# Patient Record
Sex: Female | Born: 1955 | Race: White | Hispanic: No | Marital: Married | Smoking: Never smoker
Health system: Southern US, Community
[De-identification: ages and names within clinical notes are randomized; demographics above are authoritative.]

## PROBLEM LIST (undated history)

## (undated) DIAGNOSIS — Z803 Family history of malignant neoplasm of breast: Secondary | ICD-10-CM

## (undated) DIAGNOSIS — M7700 Medial epicondylitis, unspecified elbow: Secondary | ICD-10-CM

## (undated) DIAGNOSIS — L719 Rosacea, unspecified: Secondary | ICD-10-CM

## (undated) DIAGNOSIS — U071 COVID-19: Secondary | ICD-10-CM

## (undated) DIAGNOSIS — J329 Chronic sinusitis, unspecified: Secondary | ICD-10-CM

## (undated) DIAGNOSIS — M7710 Lateral epicondylitis, unspecified elbow: Secondary | ICD-10-CM

## (undated) HISTORY — DX: Medial epicondylitis, unspecified elbow: M77.00

## (undated) HISTORY — DX: Rosacea, unspecified: L71.9

## (undated) HISTORY — DX: Chronic sinusitis, unspecified: J32.9

## (undated) HISTORY — DX: Family history of malignant neoplasm of breast: Z80.3

## (undated) HISTORY — PX: WISDOM TOOTH EXTRACTION: SHX21

## (undated) HISTORY — DX: COVID-19: U07.1

## (undated) HISTORY — DX: Lateral epicondylitis, unspecified elbow: M77.10

---

## 2017-01-10 ENCOUNTER — Telehealth: Payer: Self-pay | Admitting: Family

## 2017-01-10 NOTE — Telephone Encounter (Signed)
Pt is a new patient and will see Dr. Shirlee LatchMcLean on 01/28/2017. She is requesting a refill on 60 Doxycycline Hyclate 50mg  cap. She is only requesting enough until her appt with Dr. Shirlee LatchMcLean. Please call her at [phone].

## 2017-01-10 NOTE — Telephone Encounter (Signed)
See below message I advised her to contact PCP , due to we didn't have we didn't know dosage of medication or her allergies. Patient verbalized understanding.

## 2017-01-28 ENCOUNTER — Other Ambulatory Visit (HOSPITAL_COMMUNITY)
Admission: RE | Admit: 2017-01-28 | Discharge: 2017-01-28 | Disposition: A | Discharge status: Home / Self Care | Payer: BC Managed Care – PPO | Source: Ambulatory Visit | Attending: Internal Medicine | Admitting: Internal Medicine

## 2017-01-28 ENCOUNTER — Ambulatory Visit: Payer: BC Managed Care – PPO | Admitting: Internal Medicine

## 2017-01-28 ENCOUNTER — Encounter: Payer: Self-pay | Admitting: Internal Medicine

## 2017-01-28 VITALS — BP 110/72 | HR 95 | Temp 98.5°F | Ht 66.75 in | Wt 159.4 lb

## 2017-01-28 DIAGNOSIS — Z1329 Encounter for screening for other suspected endocrine disorder: Secondary | ICD-10-CM

## 2017-01-28 DIAGNOSIS — N841 Polyp of cervix uteri: Secondary | ICD-10-CM

## 2017-01-28 DIAGNOSIS — Z1159 Encounter for screening for other viral diseases: Secondary | ICD-10-CM | POA: Diagnosis not present

## 2017-01-28 DIAGNOSIS — E559 Vitamin D deficiency, unspecified: Secondary | ICD-10-CM

## 2017-01-28 DIAGNOSIS — Z124 Encounter for screening for malignant neoplasm of cervix: Secondary | ICD-10-CM | POA: Insufficient documentation

## 2017-01-28 DIAGNOSIS — M7701 Medial epicondylitis, right elbow: Secondary | ICD-10-CM | POA: Diagnosis not present

## 2017-01-28 DIAGNOSIS — Z0000 Encounter for general adult medical examination without abnormal findings: Secondary | ICD-10-CM | POA: Diagnosis not present

## 2017-01-28 DIAGNOSIS — M7711 Lateral epicondylitis, right elbow: Secondary | ICD-10-CM

## 2017-01-28 DIAGNOSIS — M7710 Lateral epicondylitis, unspecified elbow: Secondary | ICD-10-CM | POA: Insufficient documentation

## 2017-01-28 DIAGNOSIS — L719 Rosacea, unspecified: Secondary | ICD-10-CM | POA: Insufficient documentation

## 2017-01-28 DIAGNOSIS — Z803 Family history of malignant neoplasm of breast: Secondary | ICD-10-CM | POA: Insufficient documentation

## 2017-01-28 DIAGNOSIS — N889 Noninflammatory disorder of cervix uteri, unspecified: Secondary | ICD-10-CM | POA: Insufficient documentation

## 2017-01-28 DIAGNOSIS — M7700 Medial epicondylitis, unspecified elbow: Secondary | ICD-10-CM | POA: Insufficient documentation

## 2017-01-28 LAB — CBC WITH DIFFERENTIAL/PLATELET
BASOS PCT: 0.8 % (ref 0.0–3.0)
Basophils Absolute: 0 10*3/uL (ref 0.0–0.1)
EOS PCT: 9.2 % — AB (ref 0.0–5.0)
Eosinophils Absolute: 0.5 10*3/uL (ref 0.0–0.7)
HCT: 42.8 % (ref 36.0–46.0)
HEMOGLOBIN: 14.3 g/dL (ref 12.0–15.0)
Lymphocytes Relative: 43 % (ref 12.0–46.0)
Lymphs Abs: 2.2 10*3/uL (ref 0.7–4.0)
MCHC: 33.5 g/dL (ref 30.0–36.0)
MCV: 96.3 fl (ref 78.0–100.0)
MONO ABS: 0.4 10*3/uL (ref 0.1–1.0)
Monocytes Relative: 8.3 % (ref 3.0–12.0)
NEUTROS ABS: 2 10*3/uL (ref 1.4–7.7)
NEUTROS PCT: 38.7 % — AB (ref 43.0–77.0)
PLATELETS: 279 10*3/uL (ref 150.0–400.0)
RBC: 4.45 Mil/uL (ref 3.87–5.11)
RDW: 13 % (ref 11.5–15.5)
WBC: 5 10*3/uL (ref 4.0–10.5)

## 2017-01-28 LAB — URINALYSIS, ROUTINE W REFLEX MICROSCOPIC
Bilirubin Urine: NEGATIVE
Hgb urine dipstick: NEGATIVE
Ketones, ur: NEGATIVE
Nitrite: NEGATIVE
RBC / HPF: NONE SEEN (ref 0–?)
Specific Gravity, Urine: 1.01 (ref 1.000–1.030)
Total Protein, Urine: NEGATIVE
URINE GLUCOSE: NEGATIVE
Urobilinogen, UA: 0.2 (ref 0.0–1.0)
pH: 6 (ref 5.0–8.0)

## 2017-01-28 LAB — COMPREHENSIVE METABOLIC PANEL
ALT: 19 U/L (ref 0–35)
AST: 26 U/L (ref 0–37)
Albumin: 4.2 g/dL (ref 3.5–5.2)
Alkaline Phosphatase: 102 U/L (ref 39–117)
BUN: 19 mg/dL (ref 6–23)
CO2: 31 meq/L (ref 19–32)
Calcium: 9.5 mg/dL (ref 8.4–10.5)
Chloride: 102 mEq/L (ref 96–112)
Creatinine, Ser: 1.02 mg/dL (ref 0.40–1.20)
GFR: 58.27 mL/min — ABNORMAL LOW (ref >60.00)
GLUCOSE: 84 mg/dL (ref 70–99)
POTASSIUM: 4.3 meq/L (ref 3.5–5.1)
SODIUM: 139 meq/L (ref 135–145)
Total Bilirubin: 0.8 mg/dL (ref 0.2–1.2)
Total Protein: 7.6 g/dL (ref 6.0–8.3)

## 2017-01-28 LAB — LIPID PANEL
CHOLESTEROL: 176 mg/dL (ref 0–200)
HDL: 68.3 mg/dL (ref >39.00)
LDL CALC: 95 mg/dL (ref 0–99)
NonHDL: 107.34
Total CHOL/HDL Ratio: 3
Triglycerides: 64 mg/dL (ref 0.0–149.0)
VLDL: 12.8 mg/dL (ref 0.0–40.0)

## 2017-01-28 LAB — T4, FREE: FREE T4: 0.73 ng/dL (ref 0.60–1.60)

## 2017-01-28 LAB — TSH: TSH: 2.3 u[IU]/mL (ref 0.35–4.50)

## 2017-01-28 LAB — VITAMIN D 25 HYDROXY (VIT D DEFICIENCY, FRACTURES): VITD: 49.11 ng/mL (ref 30.00–100.00)

## 2017-01-28 MED ORDER — METRONIDAZOLE 1 % EX GEL: Freq: Every day | CUTANEOUS | 1 refills | Status: DC

## 2017-01-28 MED ORDER — RALOXIFENE HCL 60 MG PO TABS: 60.0000 mg | ORAL_TABLET | Freq: Every day | ORAL | 1 refills | Status: DC

## 2017-01-28 NOTE — Progress Notes (Signed)
Chief Complaint  Patient presents with  . Establish Care   New patient to establish care previous at [facility] physical  1. FH breast cancer in mom and m aunt has never had genetic testing was on Tamoxifen x 5 years and then switched to Raloxifene and prior PCP was managing and she has been on this medication x 2 years w/o generic counseling. Advised pt I am unsure if we should continue this medication would like for OB/GYN to weigh in and also will refer [facility] for genetic testing BRCA and my myriad but unsure if OB/GYN does this at their clinic.  2. Rosacea-She has been on Doxycycline x years and reports tried Metrogel in the past so long cant remember if helped of not  3. Medial and lateral epicondylitis she had steroid inj at [facility] 06/2016 and has tried bracing   Review of Systems  Constitutional: Negative for weight loss.  HENT: Positive for hearing loss.        +wears hearing aids   Respiratory: Negative for shortness of breath.   Cardiovascular: Negative for chest pain.  Gastrointestinal: Negative for abdominal pain.  Genitourinary:       Denies GU bleeding   Musculoskeletal: Positive for joint pain.  Skin:       +rosacea   Neurological: Negative for headaches.  Psychiatric/Behavioral: Negative for memory loss.   Past Medical History:  Diagnosis Date  . Family history of breast cancer    mother and m. aunt   . Lateral epicondylitis   . Medial epicondylitis    Past Surgical History:  Procedure Laterality Date  . WISDOM TOOTH EXTRACTION     Family History  Problem Relation Age of Onset  . Arthritis Mother   . Cancer Mother        breast   . Cancer Father        prostate, kidney  . Alcohol abuse Sister   . Cancer Sister   . Early death Sister   . Cancer Maternal Aunt        breast   . Arthritis Sister    Social History   Socioeconomic History  . Marital status: Married    Spouse name: Not on file  . Number of children: Not on file   . Years of education: Not on file  . Highest education level: Not on file  Social Needs  . Financial resource strain: Not on file  . Food insecurity - worry: Not on file  . Food insecurity - inability: Not on file  . Transportation needs - medical: Not on file  . Transportation needs - non-medical: Not on file  Occupational History  . Not on file  Tobacco Use  . Smoking status: Never Smoker  . Smokeless tobacco: Never Used  Substance and Sexual Activity  . Alcohol use: No    Frequency: Never  . Drug use: No  . Sexual activity: Not on file  Other Topics Concern  . 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 buPROPion (WELLBUTRIN XL) 150 MG 24 hr tablet Take 150 mg by mouth daily.  . Calcium Carb-Cholecalciferol (CALCIUM 600-D PO) Take 1 tablet by mouth daily.  . Cholecalciferol (VITAMIN D-3) 1000 units CAPS Take 1 capsule by mouth daily.  Marland Kitchen doxycycline (ADOXA) 50 MG tablet Take 50 mg by mouth daily.   Marland Kitchen  metroNIDAZOLE (METROGEL) 1 % gel Apply topically daily. Qd to face for rosacea  . Omega-3 Fatty Acids (FISH OIL) 1200 MG CAPS Take 1 capsule by mouth daily.  . raloxifene (EVISTA) 60 MG tablet Take 1 tablet (60 mg total) by mouth daily.  . [DISCONTINUED] raloxifene (EVISTA) 60 MG tablet Take 60 mg by mouth daily.   No Known Allergies Recent Results (from the past 2160 hour(s))  Comprehensive metabolic panel     Status: Abnormal   Collection Time: 01/28/17  9:54 AM  Result Value Ref Range   Sodium 139 135 - 145 mEq/L   Potassium 4.3 3.5 - 5.1 mEq/L   Chloride 102 96 - 112 mEq/L   CO2 31 19 - 32 mEq/L   Glucose, Bld 84 70 - 99 mg/dL   BUN 19 6 - 23 mg/dL   Creatinine, Ser 1.02 0.40 - 1.20 mg/dL   Total Bilirubin 0.8 0.2 - 1.2 mg/dL   Alkaline Phosphatase 102 39 - 117 U/L   AST 26 0 - 37 U/L   ALT 19 0 - 35 U/L   Total Protein 7.6 6.0 - 8.3 g/dL   Albumin 4.2 3.5 - 5.2 g/dL   Calcium  9.5 8.4 - 10.5 mg/dL   GFR 58.27 (L) >60.00 mL/min  CBC with Differential/Platelet     Status: Abnormal   Collection Time: 01/28/17  9:54 AM  Result Value Ref Range   WBC 5.0 4.0 - 10.5 K/uL   RBC 4.45 3.87 - 5.11 Mil/uL   Hemoglobin 14.3 12.0 - 15.0 g/dL   HCT 42.8 36.0 - 46.0 %   MCV 96.3 78.0 - 100.0 fl   MCHC 33.5 30.0 - 36.0 g/dL   RDW 13.0 11.5 - 15.5 %   Platelets 279.0 150.0 - 400.0 K/uL   Neutrophils Relative % 38.7 (L) 43.0 - 77.0 %   Lymphocytes Relative 43.0 12.0 - 46.0 %   Monocytes Relative 8.3 3.0 - 12.0 %   Eosinophils Relative 9.2 (H) 0.0 - 5.0 %   Basophils Relative 0.8 0.0 - 3.0 %   Neutro Abs 2.0 1.4 - 7.7 K/uL   Lymphs Abs 2.2 0.7 - 4.0 K/uL   Monocytes Absolute 0.4 0.1 - 1.0 K/uL   Eosinophils Absolute 0.5 0.0 - 0.7 K/uL   Basophils Absolute 0.0 0.0 - 0.1 K/uL  Lipid panel     Status: None   Collection Time: 01/28/17  9:54 AM  Result Value Ref Range   Cholesterol 176 0 - 200 mg/dL    Comment: ATP III Classification       Desirable:  < 200 mg/dL               Borderline High:  200 - 239 mg/dL          High:  > = 240 mg/dL   Triglycerides 64.0 0.0 - 149.0 mg/dL    Comment: Normal:  <150 mg/dLBorderline High:  150 - 199 mg/dL   HDL 68.30 >39.00 mg/dL   VLDL 12.8 0.0 - 40.0 mg/dL   LDL Cholesterol 95 0 - 99 mg/dL   Total CHOL/HDL Ratio 3     Comment:                Men          Women1/2 Average Risk     3.4          3.3Average Risk          5.0  4.42X Average Risk          9.6          7.13X Average Risk          15.0          11.0                       NonHDL 107.34     Comment: NOTE:  Non-HDL goal should be 30 mg/dL higher than patient's LDL goal (i.e. LDL goal of < 70 mg/dL, would have non-HDL goal of < 100 mg/dL)  T4, free     Status: None   Collection Time: 01/28/17  9:54 AM  Result Value Ref Range   Free T4 0.73 0.60 - 1.60 ng/dL    Comment: Specimens from patients who are undergoing biotin therapy and /or ingesting biotin supplements may  contain high levels of biotin.  The higher biotin concentration in these specimens interferes with this Free T4 assay.  Specimens that contain high levels  of biotin may cause false high results for this Free T4 assay.  Please interpret results in light of the total clinical presentation of the patient.    TSH     Status: None   Collection Time: 01/28/17  9:54 AM  Result Value Ref Range   TSH 2.30 0.35 - 4.50 uIU/mL  Urinalysis, Routine w reflex microscopic     Status: Abnormal   Collection Time: 01/28/17  9:54 AM  Result Value Ref Range   Color, Urine YELLOW Yellow;Lt. Yellow   APPearance CLEAR Clear   Specific Gravity, Urine 1.010 1.000 - 1.030   pH 6.0 5.0 - 8.0   Total Protein, Urine NEGATIVE Negative   Urine Glucose NEGATIVE Negative   Ketones, ur NEGATIVE Negative   Bilirubin Urine NEGATIVE Negative   Hgb urine dipstick NEGATIVE Negative   Urobilinogen, UA 0.2 0.0 - 1.0   Leukocytes, UA SMALL (A) Negative   Nitrite NEGATIVE Negative   WBC, UA 7-10/hpf (A) 0-2/hpf   RBC / HPF none seen 0-2/hpf   Squamous Epithelial / LPF Rare(0-4/hpf) Rare(0-4/hpf)   Renal Epithel, UA Rare(0-4/hpf) (A) None   Bacteria, UA Few(10-50/hpf) (A) None  Vitamin D (25 hydroxy)     Status: None   Collection Time: 01/28/17  9:54 AM  Result Value Ref Range   VITD 49.11 30.00 - 100.00 ng/mL   Objective  Body mass index is 25.15 kg/m. Wt Readings from Last 3 Encounters:  01/28/17 159 lb 6 oz (72.3 kg)   Temp Readings from Last 3 Encounters:  01/28/17 98.5 F (36.9 C) (Oral)   BP Readings from Last 3 Encounters:  01/28/17 110/72   Pulse Readings from Last 3 Encounters:  01/28/17 95   O2 room air 98%   Physical Exam  Constitutional: She is oriented to person, place, and time and well-developed, well-nourished, and in no distress. Vital signs are normal.  HENT:  Head: Normocephalic and atraumatic.  Mouth/Throat: Oropharynx is clear and moist and mucous membranes are normal.  Eyes:  Conjunctivae are normal. Pupils are equal, round, and reactive to light.  Cardiovascular: Normal rate, regular rhythm and normal heart sounds.  Neg leg edema b/l   Pulmonary/Chest: Effort normal and breath sounds normal. She has no wheezes.  Abdominal: Soft. Bowel sounds are normal. There is no tenderness.  Genitourinary: Vagina normal, uterus normal, right adnexa normal, left adnexa normal and vulva normal. Cervix exhibits lesion.  Genitourinary Comments: Pt reports h/o ext hemorrhoids  ?  Small cervical polyp on exam today protruding from Os   Neurological: She is alert and oriented to person, place, and time. Gait normal. Gait normal.  Skin: Skin is warm, dry and intact.  Psychiatric: Mood, memory, affect and judgment normal.  Nursing note and vitals reviewed.   Assessment   1. FH breast cancer with long term hormonal tx in patient at least 7 years (5 with Tamoxifen and 2 with Raloxifene)  2. Cervical lesion ? Polyp 3. Rosacea with long term use of doxycycline  4. Medial and lateral epicondylitis  5. HM/annual wellness  Plan  1 and 2.  Refer to OB/GYN will defer to them: -Evaluate need for SERM -Evaluate cervical lesion  -Refer for or do genetic BRCA or My myriad  Disc [facility] does genetic testing if OB/GYN does not  3. Trial off doxycyline and try topicals  She has been on oral abs long term disc side effect of resistance  4. F/u [facility] prn  Bracing elbow  5.  Had flu shot 12/2016  Tdap had 01/16/16 Will disc shingrix in future  Check hep B status   Last pap 01/2015 did pap today Need to get copy colonoscopy 06/29/07 Sherlyn Hay [facility]fe Need to get copy mammogram 02/2016 due 02/2017   Get records [facility].   Check labs CMET, CBC, lipid, TSH, T4, UA, hep B, status, vit D.   Provider: Dr. Olivia Mackie McLean-Scocuzza-Internal Medicine

## 2017-01-28 NOTE — Patient Instructions (Addendum)
Follow up in 2 months sooner if needed  Try Metrogel to face and trial off Doxycycline We will refer for genetic testing BRCA and My myriad  We will refer to OB/GYN [place] clinic to disc FH breast cancer, ?cervical polyp and about getting off Raloxifen   Rosacea Rosacea is a long-term (chronic) condition that affects the skin of the face, including the cheeks, nose, brow, and chin. This condition can also affect the eyes. Rosacea causes blood vessels near the surface of the skin to enlarge, which results in redness. What are the causes? The cause of this condition is not known. Certain triggers can make rosacea worse, including:  Hot baths.  Exercise.  Sunlight.  Very hot or cold temperatures.  Hot or spicy foods and drinks.  Drinking alcohol.  Stress.  Taking blood pressure medicine.  Long-term use of topical steroids on the face.  What increases the risk? This condition is more likely to develop in:  People who are older than 62 years of age.  Women.  People who have light-colored skin (light complexion).  People who have a family history of rosacea.  What are the signs or symptoms? Symptoms of this condition include:  Redness of the face.  Red bumps or pimples on the face.  A red, enlarged nose.  Blushing easily.  Red lines on the skin.  Irritated or burning feeling in the eyes.  Swollen eyelids.  How is this diagnosed? This condition is diagnosed with a medical history and physical exam. How is this treated? There is no cure for this condition, but treatment can help to control your symptoms. Your health care provider may recommend that you see a skin specialist (dermatologist). Treatment may include:  Antibiotic medicines that are applied to the skin or taken as a pill.  Laser treatment to improve the appearance of the skin.  Surgery. This is rare.  Your health care provider will also recommend the best way to take care of your skin. Even  after your skin improves, you will likely need to continue treatment to prevent your rosacea from coming back. Follow these instructions at home: Skin Care Take care of your skin as told by your health care provider. You may be told to do these things:  Wash your skin gently two or more times each day.  Use mild soap.  Use a sunscreen or sunblock with SPF 30 or greater.  Use gentle cosmetics that are meant for sensitive skin.  Shave with an electric shaver instead of a blade.  Lifestyle  Try to keep track of what foods trigger this condition. Avoid any triggers. These may include: ? Spicy foods. ? Seafood. ? Cheese. ? Hot liquids. ? Nuts. ? Chocolate. ? Iodized salt.  Do not drink alcohol.  Avoid extremely cold or hot temperatures.  Try to reduce your stress. If you need help, talk with your health care provider.  When you exercise, do these things to stay cool: ? Limit your sun exposure. ? Use a fan. ? Do shorter and more frequent intervals of exercise. General instructions  Keep all follow-up visits as told by your health care provider. This is important.  Take over-the-counter and prescription medicines only as told by your health care provider.  If your eyelids are affected, apply warm compresses to them. Do this as told by your health care provider.  If you were prescribed an antibiotic medicine, apply or take it as told by your health care provider. Do not stop using the  antibiotic even if your condition improves. Contact a health care provider if:  Your symptoms get worse.  Your symptoms do not improve after two months of treatment.  You have new symptoms.  You have any changes in vision or you have problems with your eyes, such as redness or itching.  You feel depressed.  You lose your appetite.  You have trouble concentrating. This information is not intended to replace advice given to you by your health care provider. Make sure you discuss any  questions you have with your health care provider. Document Released: 03/04/2004 Document Revised: 07/03/2015 Document Reviewed: 04/03/2014 Elsevier Interactive Patient Education  [employer].

## 2017-01-29 LAB — HEPATITIS B SURFACE ANTIBODY, QUANTITATIVE: Hepatitis B-Post: 101 m[IU]/mL (ref >=10)

## 2017-02-03 LAB — CYTOLOGY - PAP
Diagnosis: NEGATIVE
HPV: NOT DETECTED

## 2017-02-25 ENCOUNTER — Other Ambulatory Visit: Payer: Self-pay

## 2017-02-25 NOTE — Telephone Encounter (Signed)
lov 01/28/17

## 2017-02-27 MED ORDER — RALOXIFENE HCL 60 MG PO TABS: 60.0000 mg | ORAL_TABLET | Freq: Every day | ORAL | 1 refills | Status: DC

## 2017-02-28 ENCOUNTER — Encounter: Payer: Self-pay | Admitting: Family

## 2017-03-31 ENCOUNTER — Encounter: Payer: Self-pay | Admitting: Internal Medicine

## 2017-03-31 ENCOUNTER — Ambulatory Visit: Payer: BC Managed Care – PPO | Admitting: Internal Medicine

## 2017-03-31 VITALS — BP 110/64 | HR 69 | Temp 98.3°F | Ht 67.0 in | Wt 162.6 lb

## 2017-03-31 DIAGNOSIS — Z1231 Encounter for screening mammogram for malignant neoplasm of breast: Secondary | ICD-10-CM | POA: Diagnosis not present

## 2017-03-31 DIAGNOSIS — F32A Depression, unspecified: Secondary | ICD-10-CM

## 2017-03-31 DIAGNOSIS — F329 Major depressive disorder, single episode, unspecified: Secondary | ICD-10-CM

## 2017-03-31 DIAGNOSIS — N889 Noninflammatory disorder of cervix uteri, unspecified: Secondary | ICD-10-CM | POA: Diagnosis not present

## 2017-03-31 DIAGNOSIS — L719 Rosacea, unspecified: Secondary | ICD-10-CM | POA: Diagnosis not present

## 2017-03-31 MED ORDER — BUPROPION HCL ER (XL) 150 MG PO TB24: 150.0000 mg | ORAL_TABLET | Freq: Every day | ORAL | 3 refills | Status: DC

## 2017-03-31 NOTE — Patient Instructions (Signed)
F/u in 6-12 months sooner if needed    Rosacea Rosacea is a long-term (chronic) condition that affects the skin of the face, including the cheeks, nose, brow, and chin. This condition can also affect the eyes. Rosacea causes blood vessels near the surface of the skin to enlarge, which results in redness. What are the causes? The cause of this condition is not known. Certain triggers can make rosacea worse, including:  Hot baths.  Exercise.  Sunlight.  Very hot or cold temperatures.  Hot or spicy foods and drinks.  Drinking alcohol.  Stress.  Taking blood pressure medicine.  Long-term use of topical steroids on the face.  What increases the risk? This condition is more likely to develop in:  People who are older than 63 years of age.  Women.  People who have light-colored skin (light complexion).  People who have a family history of rosacea.  What are the signs or symptoms? Symptoms of this condition include:  Redness of the face.  Red bumps or pimples on the face.  A red, enlarged nose.  Blushing easily.  Red lines on the skin.  Irritated or burning feeling in the eyes.  Swollen eyelids.  How is this diagnosed? This condition is diagnosed with a medical history and physical exam. How is this treated? There is no cure for this condition, but treatment can help to control your symptoms. Your health care provider may recommend that you see a skin specialist (dermatologist). Treatment may include:  Antibiotic medicines that are applied to the skin or taken as a pill.  Laser treatment to improve the appearance of the skin.  Surgery. This is rare.  Your health care provider will also recommend the best way to take care of your skin. Even after your skin improves, you will likely need to continue treatment to prevent your rosacea from coming back. Follow these instructions at home: Skin Care Take care of your skin as told by your health care provider. You  may be told to do these things:  Wash your skin gently two or more times each day.  Use mild soap.  Use a sunscreen or sunblock with SPF 30 or greater.  Use gentle cosmetics that are meant for sensitive skin.  Shave with an electric shaver instead of a blade.  Lifestyle  Try to keep track of what foods trigger this condition. Avoid any triggers. These may include: ? Spicy foods. ? Seafood. ? Cheese. ? Hot liquids. ? Nuts. ? Chocolate. ? Iodized salt.  Do not drink alcohol.  Avoid extremely cold or hot temperatures.  Try to reduce your stress. If you need help, talk with your health care provider.  When you exercise, do these things to stay cool: ? Limit your sun exposure. ? Use a fan. ? Do shorter and more frequent intervals of exercise. General instructions  Keep all follow-up visits as told by your health care provider. This is important.  Take over-the-counter and prescription medicines only as told by your health care provider.  If your eyelids are affected, apply warm compresses to them. Do this as told by your health care provider.  If you were prescribed an antibiotic medicine, apply or take it as told by your health care provider. Do not stop using the antibiotic even if your condition improves. Contact a health care provider if:  Your symptoms get worse.  Your symptoms do not improve after two months of treatment.  You have new symptoms.  You have any changes in  vision or you have problems with your eyes, such as redness or itching.  You feel depressed.  You lose your appetite.  You have trouble concentrating. This information is not intended to replace advice given to you by your health care provider. Make sure you discuss any questions you have with your health care provider. Document Released: 03/04/2004 Document Revised: 07/03/2015 Document Reviewed: 04/03/2014 Elsevier Interactive Patient Education  [employer]2018 [employer]

## 2017-03-31 NOTE — Progress Notes (Addendum)
Chief Complaint  Patient presents with  . Follow-up   Follow up doing well 1. Cervical polyp removed with Dr. [facility] and rec stop Ev[place] she is not interested in genetic testing currently for strong FH breast cancer pap neg 01/28/17 neg HPV 2. Due for mammo will refer  3. Rosacea doing well on metrogel off years of doxycycline      Review of Systems  Constitutional: Negative for weight loss.  HENT: Negative for hearing loss.   Eyes: Negative for blurred vision.  Respiratory: Negative for shortness of breath.   Cardiovascular: Negative for chest pain.  Gastrointestinal: Negative for abdominal pain and blood in stool.  Genitourinary:       Denies GU bleeding  Musculoskeletal: Negative for falls.  Skin: Negative for rash.  Neurological: Negative for headaches.  Psychiatric/Behavioral: Negative for depression.   Past Medical History:  Diagnosis Date  . Family history of breast cancer    mother and m. aunt   . Lateral epicondylitis   . Medial epicondylitis   . Rosacea    Past Surgical History:  Procedure Laterality Date  . WISDOM TOOTH EXTRACTION     Family History  Problem Relation Age of Onset  . Arthritis Mother   . Cancer Mother        breast   . Cancer Father        prostate, kidney  . Alcohol abuse Sister   . Cancer Sister   . Early death Sister   . Cancer Maternal Aunt        breast   . Arthritis Sister    Social History   Socioeconomic History  . Marital status: Married    Spouse name: Not on file  . Number of children: Not on file  . Years of education: Not on file  . Highest education level: Not on file  Social Needs  . Financial resource strain: Not on file  . Food insecurity - worry: Not on file  . Food insecurity - inability: Not on file  . Transportation needs - medical: Not on file  . Transportation needs - non-medical: Not on file  Occupational History  . Not on file  Tobacco Use  . Smoking status: Never Smoker  . Smokeless  tobacco: Never Used  Substance and Sexual Activity  . Alcohol use: No    Frequency: Never  . Drug use: No  . Sexual activity: Not on file  Other Topics Concern  . 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 buPROPion (WELLBUTRIN XL) 150 MG 24 hr tablet Take 150 mg by mouth daily.  . Calcium Carb-Cholecalciferol (CALCIUM 600-D PO) Take 1 tablet by mouth daily.  . Cholecalciferol (VITAMIN D-3) 1000 units CAPS Take 1 capsule by mouth daily.  . metroNIDAZOLE (METROGEL) 1 % gel Apply topically daily. Qd to face for rosacea  . Omega-3 Fatty Acids (FISH OIL) 1200 MG CAPS Take 1 capsule by mouth daily.   No Known Allergies Recent Results (from the past 2160 hour(s))  Cytology - PAP     Status: None   Collection Time: 01/28/17 12:00 AM  Result Value Ref Range   Adequacy      Satisfactory for evaluation. The presence or absence of an endocervical / transformation zone component cannot be determined because of atrophy.   Diagnosis      NEGATIVE FOR INTRAEPITHELIAL  LESIONS OR MALIGNANCY.   HPV NOT DETECTED     Comment: Normal Reference Range - NOT Detected   Material Submitted CervicoVaginal Pap [ThinPrep Imaged]    CYTOLOGY - PAP PAP RESULT   Comprehensive metabolic panel     Status: Abnormal   Collection Time: 01/28/17  9:54 AM  Result Value Ref Range   Sodium 139 135 - 145 mEq/L   Potassium 4.3 3.5 - 5.1 mEq/L   Chloride 102 96 - 112 mEq/L   CO2 31 19 - 32 mEq/L   Glucose, Bld 84 70 - 99 mg/dL   BUN 19 6 - 23 mg/dL   Creatinine, Ser 4.09 0.40 - 1.20 mg/dL   Total Bilirubin 0.8 0.2 - 1.2 mg/dL   Alkaline Phosphatase 102 39 - 117 U/L   AST 26 0 - 37 U/L   ALT 19 0 - 35 U/L   Total Protein 7.6 6.0 - 8.3 g/dL   Albumin 4.2 3.5 - 5.2 g/dL   Calcium 9.5 8.4 - 81.1 mg/dL   GFR 91.47 (L) >82.95 mL/min  CBC with Differential/Platelet     Status: Abnormal   Collection Time: 01/28/17  9:54 AM  Result  Value Ref Range   WBC 5.0 4.0 - 10.5 K/uL   RBC 4.45 3.87 - 5.11 Mil/uL   Hemoglobin 14.3 12.0 - 15.0 g/dL   HCT 62.1 30.8 - 65.7 %   MCV 96.3 78.0 - 100.0 fl   MCHC 33.5 30.0 - 36.0 g/dL   RDW 84.6 96.2 - 95.2 %   Platelets 279.0 150.0 - 400.0 K/uL   Neutrophils Relative % 38.7 (L) 43.0 - 77.0 %   Lymphocytes Relative 43.0 12.0 - 46.0 %   Monocytes Relative 8.3 3.0 - 12.0 %   Eosinophils Relative 9.2 (H) 0.0 - 5.0 %   Basophils Relative 0.8 0.0 - 3.0 %   Neutro Abs 2.0 1.4 - 7.7 K/uL   Lymphs Abs 2.2 0.7 - 4.0 K/uL   Monocytes Absolute 0.4 0.1 - 1.0 K/uL   Eosinophils Absolute 0.5 0.0 - 0.7 K/uL   Basophils Absolute 0.0 0.0 - 0.1 K/uL  Lipid panel     Status: None   Collection Time: 01/28/17  9:54 AM  Result Value Ref Range   Cholesterol 176 0 - 200 mg/dL    Comment: ATP III Classification       Desirable:  < 200 mg/dL               Borderline High:  200 - 239 mg/dL          High:  > = 841 mg/dL   Triglycerides 32.4 0.0 - 149.0 mg/dL    Comment: Normal:  <401 mg/dLBorderline High:  150 - 199 mg/dL   HDL 02.72 >53.66 mg/dL   VLDL 44.0 0.0 - 34.7 mg/dL   LDL Cholesterol 95 0 - 99 mg/dL   Total CHOL/HDL Ratio 3     Comment:                Men          Women1/2 Average Risk     3.4          3.3Average Risk          5.0          4.42X Average Risk          9.6          7.13X Average Risk  15.0          11.0                       NonHDL 107.34     Comment: NOTE:  Non-HDL goal should be 30 mg/dL higher than patient's LDL goal (i.e. LDL goal of < 70 mg/dL, would have non-HDL goal of < 100 mg/dL)  T4, free     Status: None   Collection Time: 01/28/17  9:54 AM  Result Value Ref Range   Free T4 0.73 0.60 - 1.60 ng/dL    Comment: Specimens from patients who are undergoing biotin therapy and /or ingesting biotin supplements may contain high levels of biotin.  The higher biotin concentration in these specimens interferes with this Free T4 assay.  Specimens that contain high levels   of biotin may cause false high results for this Free T4 assay.  Please interpret results in light of the total clinical presentation of the patient.    TSH     Status: None   Collection Time: 01/28/17  9:54 AM  Result Value Ref Range   TSH 2.30 0.35 - 4.50 uIU/mL  Urinalysis, Routine w reflex microscopic     Status: Abnormal   Collection Time: 01/28/17  9:54 AM  Result Value Ref Range   Color, Urine YELLOW Yellow;Lt. Yellow   APPearance CLEAR Clear   Specific Gravity, Urine 1.010 1.000 - 1.030   pH 6.0 5.0 - 8.0   Total Protein, Urine NEGATIVE Negative   Urine Glucose NEGATIVE Negative   Ketones, ur NEGATIVE Negative   Bilirubin Urine NEGATIVE Negative   Hgb urine dipstick NEGATIVE Negative   Urobilinogen, UA 0.2 0.0 - 1.0   Leukocytes, UA SMALL (A) Negative   Nitrite NEGATIVE Negative   WBC, UA 7-10/hpf (A) 0-2/hpf   RBC / HPF none seen 0-2/hpf   Squamous Epithelial / LPF Rare(0-4/hpf) Rare(0-4/hpf)   Renal Epithel, UA Rare(0-4/hpf) (A) None   Bacteria, UA Few(10-50/hpf) (A) None  Hepatitis B surface antibody     Status: None   Collection Time: 01/28/17  9:54 AM  Result Value Ref Range   Hepatitis B-Post 101 > OR = 10 mIU/mL    Comment: . Patient has immunity to hepatitis B virus. . For additional information, please refer to http://education.[facility].com/faq/FAQ105 (This link is being provided for informational/ educational purposes only).   Vitamin D (25 hydroxy)     Status: None   Collection Time: 01/28/17  9:54 AM  Result Value Ref Range   VITD 49.11 30.00 - 100.00 ng/mL   Objective  Body mass index is 25.47 kg/m. Wt Readings from Last 3 Encounters:  03/31/17 162 lb 9.6 oz (73.8 kg)  01/28/17 159 lb 6 oz (72.3 kg)   Temp Readings from Last 3 Encounters:  03/31/17 98.3 F (36.8 C) (Oral)  01/28/17 98.5 F (36.9 C) (Oral)   BP Readings from Last 3 Encounters:  03/31/17 110/64  01/28/17 110/72   Pulse Readings from Last 3 Encounters:  03/31/17  69  01/28/17 95   O2 sat room air 98%  Physical Exam  Constitutional: She is oriented to person, place, and time and well-developed, well-nourished, and in no distress. Vital signs are normal.  HENT:  Head: Normocephalic and atraumatic.  Mouth/Throat: Oropharynx is clear and moist and mucous membranes are normal.  Eyes: Conjunctivae are normal. Pupils are equal, round, and reactive to light.  Cardiovascular: Normal rate, regular rhythm and normal heart sounds.  Pulmonary/Chest: Effort  normal and breath sounds normal.  Neurological: She is alert and oriented to person, place, and time. Gait normal. Gait normal.  Skin: Skin is warm, dry and intact.  Angiomas benign nevi to trunk  Psychiatric: Mood, memory, affect and judgment normal.  Nursing note and vitals reviewed.   Assessment   1. Rosacea dong well on topicals  2. Depression  Plan  1. Cont metrogel 2. Refilled wellbutrin  3.  Had flu shot 12/2016  Tdap had 01/16/16 disc shingrix in future given info today  -given shingrix 1/2 04/19/17 [place] [facility]  Hep B immune  Consider check hep C in future   Pap 12 21/18 neg neg HPV cervical polyp removed OB/GYN Need to get copy colonoscopy 06/29/07 [facility] [place] pt currently wants to hold on referral colonoscopy  mammo reviewed 2018 and referred today  Never smoker  Consider DEXA age 29   Provider: Dr. French Ana McLean-Scocuzza-[facility]

## 2017-03-31 NOTE — Progress Notes (Signed)
Pre visit review using our clinic review tool, if applicable. No additional management support is needed unless otherwise documented below in the visit note. 

## 2017-04-14 ENCOUNTER — Encounter: Payer: Self-pay | Admitting: Radiology

## 2017-04-14 ENCOUNTER — Ambulatory Visit
Admission: RE | Admit: 2017-04-14 | Discharge: 2017-04-14 | Disposition: A | Discharge status: Home / Self Care | Payer: BC Managed Care – PPO | Source: Ambulatory Visit | Attending: Internal Medicine | Admitting: Internal Medicine

## 2017-04-14 DIAGNOSIS — Z1231 Encounter for screening mammogram for malignant neoplasm of breast: Secondary | ICD-10-CM | POA: Insufficient documentation

## 2017-04-19 ENCOUNTER — Encounter: Payer: Self-pay | Admitting: Internal Medicine

## 2017-04-25 ENCOUNTER — Other Ambulatory Visit: Payer: Self-pay | Admitting: *Deleted

## 2017-04-25 ENCOUNTER — Inpatient Hospital Stay
Admission: RE | Admit: 2017-04-25 | Discharge: 2017-04-25 | Disposition: A | Discharge status: Home / Self Care | Payer: Self-pay | Source: Ambulatory Visit | Attending: *Deleted | Admitting: *Deleted

## 2017-04-25 DIAGNOSIS — Z9289 Personal history of other medical treatment: Secondary | ICD-10-CM

## 2018-01-19 ENCOUNTER — Encounter: Payer: Self-pay | Admitting: Internal Medicine

## 2018-01-19 ENCOUNTER — Ambulatory Visit: Payer: BC Managed Care – PPO | Admitting: Internal Medicine

## 2018-01-19 VITALS — BP 112/62 | HR 74 | Temp 98.5°F | Ht 67.0 in | Wt 157.0 lb

## 2018-01-19 DIAGNOSIS — J324 Chronic pansinusitis: Secondary | ICD-10-CM | POA: Diagnosis not present

## 2018-01-19 DIAGNOSIS — Z13220 Encounter for screening for lipoid disorders: Secondary | ICD-10-CM

## 2018-01-19 DIAGNOSIS — H6123 Impacted cerumen, bilateral: Secondary | ICD-10-CM | POA: Insufficient documentation

## 2018-01-19 DIAGNOSIS — Z0000 Encounter for general adult medical examination without abnormal findings: Secondary | ICD-10-CM | POA: Diagnosis not present

## 2018-01-19 DIAGNOSIS — Z13818 Encounter for screening for other digestive system disorders: Secondary | ICD-10-CM

## 2018-01-19 DIAGNOSIS — Z1329 Encounter for screening for other suspected endocrine disorder: Secondary | ICD-10-CM

## 2018-01-19 DIAGNOSIS — E559 Vitamin D deficiency, unspecified: Secondary | ICD-10-CM

## 2018-01-19 DIAGNOSIS — Z1389 Encounter for screening for other disorder: Secondary | ICD-10-CM

## 2018-01-19 DIAGNOSIS — Z0184 Encounter for antibody response examination: Secondary | ICD-10-CM

## 2018-01-19 DIAGNOSIS — Z1159 Encounter for screening for other viral diseases: Secondary | ICD-10-CM

## 2018-01-19 MED ORDER — FLUTICASONE PROPIONATE 50 MCG/ACT NA SUSP: 2.0000 | Freq: Every day | NASAL | 6 refills | Status: DC

## 2018-01-19 MED ORDER — AZITHROMYCIN 250 MG PO TABS: ORAL_TABLET | ORAL | 0 refills | Status: DC

## 2018-01-19 NOTE — Progress Notes (Signed)
Chief Complaint  Patient presents with  . Sinusitis   Sick visit  1. C/o sx's Monday sinus pressure, pnd, sore throat tried ibuprofen and otc antihistamine from [facility] Tues, weds. She does have cough from PND. No fever but felt warm. No body aches or sick contacts    Review of Systems  Constitutional: Negative for fever and weight loss.  HENT: Positive for sinus pain and sore throat. Negative for hearing loss.   Eyes: Negative for blurred vision.  Respiratory: Negative for shortness of breath.   Cardiovascular: Negative for chest pain.  Musculoskeletal: Negative for myalgias.   Past Medical History:  Diagnosis Date  . Family history of breast cancer    mother and m. aunt   . Lateral epicondylitis   . Medial epicondylitis   . Rosacea    Past Surgical History:  Procedure Laterality Date  . WISDOM TOOTH EXTRACTION     Family History  Problem Relation Age of Onset  . Arthritis Mother   . Cancer Mother        breast   . Breast cancer Mother [id number]  . Cancer Father        prostate, kidney  . Alcohol abuse Sister   . Cancer Sister   . Early death Sister   . Cancer Maternal Aunt        breast   . Breast cancer Maternal Aunt 40  . Arthritis Sister    Social History   Socioeconomic History  . Marital status: Married    Spouse name: Not on file  . Number of children: Not on file  . Years of education: Not on file  . Highest education level: Not on file  Occupational History  . Not on file  Social Needs  . Financial resource strain: Not on file  . Food insecurity:    Worry: Not on file    Inability: Not on file  . Transportation needs:    Medical: Not on file    Non-medical: Not on file  Tobacco Use  . Smoking status: Never Smoker  . Smokeless tobacco: Never Used  Substance and Sexual Activity  . Alcohol use: No    Frequency: Never  . Drug use: No  . Sexual activity: Not on file  Lifestyle  . Physical activity:    Days per week: Not on file    Minutes per  session: Not on file  . Stress: Not on file  Relationships  . Social connections:    Talks on phone: Not on file    Gets together: Not on file    Attends religious service: Not on file    Active member of club or organization: Not on file    Attends meetings of clubs or organizations: Not on file    Relationship status: Not on file  . Intimate partner violence:    Fear of current or ex partner: Not on file    Emotionally abused: Not on file    Physically abused: Not on file    Forced sexual activity: Not on file  Other Topics Concern  . 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 buPROPion (WELLBUTRIN XL) 150 MG 24 hr tablet Take 1 tablet (150 mg total) by mouth daily.  . Calcium Carb-Cholecalciferol (CALCIUM 600-D PO) Take 1 tablet by mouth daily.  . Cholecalciferol (VITAMIN D-3) 1000 units CAPS  Take 1 capsule by mouth daily.  . Omega-3 Fatty Acids (FISH OIL) 1200 MG CAPS Take 1 capsule by mouth daily.   No Known Allergies No results found for this or any previous visit (from the past 2160 hour(s)). Objective  Body mass index is 24.59 kg/m. Wt Readings from Last 3 Encounters:  01/19/18 157 lb (71.2 kg)  03/31/17 162 lb 9.6 oz (73.8 kg)  01/28/17 159 lb 6 oz (72.3 kg)   Temp Readings from Last 3 Encounters:  01/19/18 98.5 F (36.9 C) (Oral)  03/31/17 98.3 F (36.8 C) (Oral)  01/28/17 98.5 F (36.9 C) (Oral)   BP Readings from Last 3 Encounters:  01/19/18 112/62  03/31/17 110/64  01/28/17 110/72   Pulse Readings from Last 3 Encounters:  01/19/18 74  03/31/17 69  01/28/17 95    Physical Exam Vitals signs and nursing note reviewed.  Constitutional:      Appearance: Normal appearance. She is normal weight.  HENT:     Head: Normocephalic and atraumatic.     Right Ear: There is impacted cerumen.     Left Ear: There is impacted cerumen.     Nose:     Right Sinus: Maxillary  sinus tenderness and frontal sinus tenderness present.     Left Sinus: Maxillary sinus tenderness and frontal sinus tenderness present.  Eyes:     Conjunctiva/sclera: Conjunctivae normal.     Pupils: Pupils are equal, round, and reactive to light.  Cardiovascular:     Rate and Rhythm: Normal rate and regular rhythm.     Heart sounds: Normal heart sounds.  Pulmonary:     Effort: Pulmonary effort is normal.     Breath sounds: Normal breath sounds.  Skin:    General: Skin is warm and dry.  Neurological:     General: No focal deficit present.     Mental Status: She is alert and oriented to person, place, and time.  Psychiatric:        Attention and Perception: Attention normal.        Mood and Affect: Mood normal.        Speech: Speech normal.        Behavior: Behavior normal. Behavior is cooperative.        Thought Content: Thought content normal.        Cognition and Memory: Cognition and memory normal.        Judgment: Judgment normal.     Assessment   1. paninusitis  2. Cerumen impaction b/l L>R 3. HM Plan   1. NS, otc antihistamine, Flonase, zpack  2. Removed large amts of cerumen with currette  Disc otc debrox drops use 4-7 days monthly  3.  utd flu shot  Tdap had 01/16/16 shingrix had 2/2 doses   Hep B immune  Consider check hep C in future  sch fasting labs   Pap 12 21/18 neg neg HPV cervical polyp removed OB/GYN Need to get copy colonoscopy 06/29/07 [facility] [place] pt currently  -wants to hold on referral colonoscopy again today  mammo 04/14/17 neg  Never smoker  Consider DEXA age 66   Provider: Dr. French Ana McLean-Scocuzza-[facility]

## 2018-01-19 NOTE — Progress Notes (Signed)
Pre visit review using our clinic review tool, if applicable. No additional management support is needed unless otherwise documented below in the visit note. 

## 2018-01-19 NOTE — Patient Instructions (Addendum)
Rinse with nasal saline both sides of nose then Flonase 2 sprays until better  Continue antihistamine  Zpack  Sinusitis, Adult Sinusitis is soreness and inflammation of your sinuses. Sinuses are hollow spaces in the bones around your face. Your sinuses are located:  Around your eyes.  In the middle of your forehead.  Behind your nose.  In your cheekbones.  Your sinuses and nasal passages are lined with a stringy fluid (mucus). Mucus normally drains out of your sinuses. When your nasal tissues become inflamed or swollen, the mucus can become trapped or blocked so air cannot flow through your sinuses. This allows bacteria, viruses, and funguses to grow, which leads to infection. Sinusitis can develop quickly and last for 7?10 days (acute) or for more than 12 weeks (chronic). Sinusitis often develops after a cold. What are the causes? This condition is caused by anything that creates swelling in the sinuses or stops mucus from draining, including:  Allergies.  Asthma.  Bacterial or viral infection.  Abnormally shaped bones between the nasal passages.  Nasal growths that contain mucus (nasal polyps).  Narrow sinus openings.  Pollutants, such as chemicals or irritants in the air.  A foreign object stuck in the nose.  A fungal infection. This is rare.  What increases the risk? The following factors may make you more likely to develop this condition:  Having allergies or asthma.  Having had a recent cold or respiratory tract infection.  Having structural deformities or blockages in your nose or sinuses.  Having a weak immune system.  Doing a lot of swimming or diving.  Overusing nasal sprays.  Smoking.  What are the signs or symptoms? The main symptoms of this condition are pain and a feeling of pressure around the affected sinuses. Other symptoms include:  Upper toothache.  Earache.  Headache.  Bad breath.  Decreased sense of smell and taste.  A cough  that may get worse at night.  Fatigue.  Fever.  Thick drainage from your nose. The drainage is often green and it may contain pus (purulent).  Stuffy nose or congestion.  Postnasal drip. This is when extra mucus collects in the throat or back of the nose.  Swelling and warmth over the affected sinuses.  Sore throat.  Sensitivity to light.  How is this diagnosed? This condition is diagnosed based on symptoms, a medical history, and a physical exam. To find out if your condition is acute or chronic, your health care provider may:  Look in your nose for signs of nasal polyps.  Tap over the affected sinus to check for signs of infection.  View the inside of your sinuses using an imaging device that has a light attached (endoscope).  If your health care provider suspects that you have chronic sinusitis, you may also:  Be tested for allergies.  Have a sample of mucus taken from your nose (nasal culture) and checked for bacteria.  Have a mucus sample examined to see if your sinusitis is related to an allergy.  If your sinusitis does not respond to treatment and it lasts longer than 8 weeks, you may have an MRI or CT scan to check your sinuses. These scans also help to determine how severe your infection is. In rare cases, a bone biopsy may be done to rule out more serious types of fungal sinus disease. How is this treated? Treatment for sinusitis depends on the cause and whether your condition is chronic or acute. If a virus is causing your sinusitis,  your symptoms will go away on their own within 10 days. You may be given medicines to relieve your symptoms, including:  Topical nasal decongestants. They shrink swollen nasal passages and let mucus drain from your sinuses.  Antihistamines. These drugs block inflammation that is triggered by allergies. This can help to ease swelling in your nose and sinuses.  Topical nasal corticosteroids. These are nasal sprays that ease  inflammation and swelling in your nose and sinuses.  Nasal saline washes. These rinses can help to get rid of thick mucus in your nose.  If your condition is caused by bacteria, you will be given an antibiotic medicine. If your condition is caused by a fungus, you will be given an antifungal medicine. Surgery may be needed to correct underlying conditions, such as narrow nasal passages. Surgery may also be needed to remove polyps. Follow these instructions at home: Medicines  Take, use, or apply over-the-counter and prescription medicines only as told by your health care provider. These may include nasal sprays.  If you were prescribed an antibiotic medicine, take it as told by your health care provider. Do not stop taking the antibiotic even if you start to feel better. Hydrate and Humidify  Drink enough water to keep your urine clear or pale yellow. Staying hydrated will help to thin your mucus.  Use a cool mist humidifier to keep the humidity level in your home above 50%.  Inhale steam for 10-15 minutes, 3-4 times a day or as told by your health care provider. You can do this in the bathroom while a hot shower is running.  Limit your exposure to cool or dry air. Rest  Rest as much as possible.  Sleep with your head raised (elevated).  Make sure to get enough sleep each night. General instructions  Apply a warm, moist washcloth to your face 3-4 times a day or as told by your health care provider. This will help with discomfort.  Wash your hands often with soap and water to reduce your exposure to viruses and other germs. If soap and water are not available, use hand sanitizer.  Do not smoke. Avoid being around people who are smoking (secondhand smoke).  Keep all follow-up visits as told by your health care provider. This is important. Contact a health care provider if:  You have a fever.  Your symptoms get worse.  Your symptoms do not improve within 10 days. Get help  right away if:  You have a severe headache.  You have persistent vomiting.  You have pain or swelling around your face or eyes.  You have vision problems.  You develop confusion.  Your neck is stiff.  You have trouble breathing. This information is not intended to replace advice given to you by your health care provider. Make sure you discuss any questions you have with your health care provider. Document Released: 01/25/2005 Document Revised: 09/21/2015 Document Reviewed: 11/20/2014 Elsevier Interactive Patient Education  [employer].

## 2018-01-25 ENCOUNTER — Other Ambulatory Visit: Payer: BC Managed Care – PPO

## 2018-01-25 DIAGNOSIS — Z0000 Encounter for general adult medical examination without abnormal findings: Secondary | ICD-10-CM

## 2018-01-25 DIAGNOSIS — Z13818 Encounter for screening for other digestive system disorders: Secondary | ICD-10-CM

## 2018-01-25 DIAGNOSIS — Z1389 Encounter for screening for other disorder: Secondary | ICD-10-CM

## 2018-01-25 DIAGNOSIS — Z13220 Encounter for screening for lipoid disorders: Secondary | ICD-10-CM

## 2018-01-25 DIAGNOSIS — Z0184 Encounter for antibody response examination: Secondary | ICD-10-CM

## 2018-01-25 DIAGNOSIS — Z1159 Encounter for screening for other viral diseases: Secondary | ICD-10-CM

## 2018-01-25 DIAGNOSIS — E559 Vitamin D deficiency, unspecified: Secondary | ICD-10-CM

## 2018-01-25 DIAGNOSIS — Z1329 Encounter for screening for other suspected endocrine disorder: Secondary | ICD-10-CM

## 2018-01-25 NOTE — Addendum Note (Signed)
Addended by: Penne LashWIGGINS, Eliu Batch N on: 01/25/2018 08:30 AM   Modules accepted: Orders

## 2018-01-25 NOTE — Addendum Note (Signed)
Addended by: WIGGINS, Haroun Cotham N on: 01/25/2018 08:30 AM   Modules accepted: Orders  

## 2018-01-26 LAB — URINALYSIS, ROUTINE W REFLEX MICROSCOPIC
Bilirubin, UA: NEGATIVE
Glucose, UA: NEGATIVE
Ketones, UA: NEGATIVE
LEUKOCYTES UA: NEGATIVE
Nitrite, UA: NEGATIVE
PH UA: 5.5 (ref 5.0–7.5)
Protein, UA: NEGATIVE
RBC UA: NEGATIVE
Specific Gravity, UA: 1.013 (ref 1.005–1.030)
Urobilinogen, Ur: 0.2 mg/dL (ref 0.2–1.0)

## 2018-01-26 LAB — VITAMIN D 25 HYDROXY (VIT D DEFICIENCY, FRACTURES): VIT D 25 HYDROXY: 39 ng/mL (ref 30–100)

## 2018-01-26 LAB — COMPREHENSIVE METABOLIC PANEL
AG RATIO: 1.3 (calc) (ref 1.0–2.5)
ALBUMIN MSPROF: 4 g/dL (ref 3.6–5.1)
ALKALINE PHOSPHATASE (APISO): 118 U/L (ref 33–130)
ALT: 16 U/L (ref 6–29)
AST: 22 U/L (ref 10–35)
BUN: 17 mg/dL (ref 7–25)
CHLORIDE: 104 mmol/L (ref 98–110)
CO2: 28 mmol/L (ref 20–32)
Calcium: 9.6 mg/dL (ref 8.6–10.4)
Creat: 0.89 mg/dL (ref 0.50–0.99)
GLOBULIN: 3 g/dL (ref 1.9–3.7)
Glucose, Bld: 84 mg/dL (ref 65–99)
POTASSIUM: 4.7 mmol/L (ref 3.5–5.3)
Sodium: 141 mmol/L (ref 135–146)
Total Bilirubin: 0.8 mg/dL (ref 0.2–1.2)
Total Protein: 7 g/dL (ref 6.1–8.1)

## 2018-01-26 LAB — CBC WITH DIFFERENTIAL/PLATELET
ABSOLUTE MONOCYTES: 375 {cells}/uL (ref 200–950)
BASOS PCT: 1.4 %
Basophils Absolute: 70 cells/uL (ref 0–200)
EOS ABS: 415 {cells}/uL (ref 15–500)
Eosinophils Relative: 8.3 %
HCT: 41.4 % (ref 35.0–45.0)
Hemoglobin: 14.1 g/dL (ref 11.7–15.5)
Lymphs Abs: 2055 cells/uL (ref 850–3900)
MCH: 31.5 pg (ref 27.0–33.0)
MCHC: 34.1 g/dL (ref 32.0–36.0)
MCV: 92.6 fL (ref 80.0–100.0)
MONOS PCT: 7.5 %
MPV: 9.7 fL (ref 7.5–12.5)
NEUTROS PCT: 41.7 %
Neutro Abs: 2085 cells/uL (ref 1500–7800)
PLATELETS: 358 10*3/uL (ref 140–400)
RBC: 4.47 10*6/uL (ref 3.80–5.10)
RDW: 12.1 % (ref 11.0–15.0)
TOTAL LYMPHOCYTE: 41.1 %
WBC: 5 10*3/uL (ref 3.8–10.8)

## 2018-01-26 LAB — HEPATITIS C ANTIBODY
HEP C AB: NONREACTIVE
SIGNAL TO CUT-OFF: 0.02 (ref <1.00)

## 2018-01-26 LAB — MEASLES/MUMPS/RUBELLA IMMUNITY
Mumps IgG: 285 AU/mL
RUBELLA: 17.3 {index}
Rubeola IgG: 14.7 AU/mL — ABNORMAL LOW

## 2018-01-26 LAB — LIPID PANEL
Cholesterol: 176 mg/dL (ref <200)
HDL: 50 mg/dL — ABNORMAL LOW (ref >50)
LDL Cholesterol (Calc): 107 mg/dL (calc) — ABNORMAL HIGH
NON-HDL CHOLESTEROL (CALC): 126 mg/dL (ref <130)
TRIGLYCERIDES: 94 mg/dL (ref <150)
Total CHOL/HDL Ratio: 3.5 (calc) (ref <5.0)

## 2018-01-26 LAB — TSH: TSH: 2.26 m[IU]/L (ref 0.40–4.50)

## 2018-02-09 ENCOUNTER — Encounter: Payer: Self-pay | Admitting: Emergency Medicine

## 2018-02-09 ENCOUNTER — Emergency Department
Admission: EM | Admit: 2018-02-09 | Discharge: 2018-02-09 | Disposition: A | Discharge status: Home / Self Care | Payer: BC Managed Care – PPO | Attending: Emergency Medicine | Admitting: Emergency Medicine

## 2018-02-09 ENCOUNTER — Ambulatory Visit: Payer: BC Managed Care – PPO | Admitting: Family Medicine

## 2018-02-09 ENCOUNTER — Other Ambulatory Visit: Payer: Self-pay

## 2018-02-09 DIAGNOSIS — R200 Anesthesia of skin: Secondary | ICD-10-CM | POA: Diagnosis present

## 2018-02-09 DIAGNOSIS — M7701 Medial epicondylitis, right elbow: Secondary | ICD-10-CM | POA: Insufficient documentation

## 2018-02-09 DIAGNOSIS — Z79899 Other long term (current) drug therapy: Secondary | ICD-10-CM | POA: Diagnosis not present

## 2018-02-09 MED ORDER — PREDNISONE 10 MG (21) PO TBPK: ORAL_TABLET | Freq: Every day | ORAL | 0 refills | Status: DC

## 2018-02-09 NOTE — ED Notes (Signed)
See triage note  States she tried to grip her phone about 2 am and was unable to   States she thought she may have slept wrong.  States sx's cont's when she woke up   The numbness is in 4 fingers only   Grip is weak  No slurred speech or facial droop

## 2018-02-09 NOTE — Discharge Instructions (Signed)
Follow discharge care instructions.  Take medication as directed.  Follow-up with orthopedic if no improvement in 1 week.

## 2018-02-09 NOTE — ED Provider Notes (Signed)
[facility] Emergency Department Provider Note   ____________________________________________   First MD Initiated Contact with Patient 02/09/18 1001     (approximate)  I have reviewed the triage vital signs and the nursing notes.  [name]  HISTORY  Chief Complaint Numbness     HPI Misty Rojas is a 63 y.o. female  patient presents with decreased grip strength of the right hand and numbness in the third through fifth fingers.  Patient denies facial weakness, vision disturbance or vertigo.  Patient denies provocative incident for complaint.  Patient that she awakened up this morning check the phone and know she had decreased grip strength.  Patient states slight improvement since AM awakening.  Patient is right-handed.  Patient had previous history of lateral and medial epicondylitis.  Patient denies pain.  Past Medical History:  Diagnosis Date  . Family history of breast cancer    mother and m. aunt   . Lateral epicondylitis   . Medial epicondylitis   . Rosacea     Patient Active Problem List   Diagnosis Date Noted  . Pansinusitis 01/19/2018  . Bilateral impacted cerumen 01/19/2018  . Family history of breast cancer 01/28/2017  . Rosacea 01/28/2017  . Medial epicondylitis 01/28/2017  . Lateral epicondylitis 01/28/2017  . Wellness examination 01/28/2017    Past Surgical History:  Procedure Laterality Date  . WISDOM TOOTH EXTRACTION      Prior to Admission medications   Medication Sig Start Date End Date Taking? Authorizing Provider  azithromycin (ZITHROMAX) 250 MG tablet 2 pills today, 1 pill day 2-5 01/19/18   McLean-Scocuzza, Pasty Spillers, MD  buPROPion (WELLBUTRIN XL) 150 MG 24 hr tablet Take 1 tablet (150 mg total) by mouth daily. 03/31/17   McLean-Scocuzza, Pasty Spillers, MD  Calcium Carb-Cholecalciferol (CALCIUM 600-D PO) Take 1 tablet by mouth daily.    [provider]  Cholecalciferol (VITAMIN D-3) 1000 units CAPS Take 1  capsule by mouth daily.    [provider]  fluticasone (FLONASE) 50 MCG/ACT nasal spray Place 2 sprays into both nostrils daily. 01/19/18   McLean-Scocuzza, Pasty Spillers, MD  Omega-3 Fatty Acids (FISH OIL) 1200 MG CAPS Take 1 capsule by mouth daily.    [provider]  predniSONE (STERAPRED UNI-PAK 21 TAB) 10 MG (21) TBPK tablet Take by mouth daily. 02/09/18   Joni Reining, PA-C    Allergies Patient has no known allergies.  Family History  Problem Relation Age of Onset  . Arthritis Mother   . Cancer Mother        breast   . Breast cancer Mother 74  . Cancer Father        prostate, kidney  . Alcohol abuse Sister   . Cancer Sister   . Early death Sister   . Cancer Maternal Aunt        breast   . Breast cancer Maternal Aunt 40  . Arthritis Sister     Social History Social History   Tobacco Use  . Smoking status: Never Smoker  . Smokeless tobacco: Never Used  Substance Use Topics  . Alcohol use: No    Frequency: Never  . Drug use: No    Review of Systems Constitutional: No fever/chills Eyes: No visual changes. ENT: No sore throat. Cardiovascular: Denies chest pain. Respiratory: Denies shortness of breath. Gastrointestinal: No abdominal pain.  No nausea, no vomiting.  No diarrhea.  No constipation. Genitourinary: Negative for dysuria. Musculoskeletal: Decreased right hand grip strength. Skin: Negative for rash.  Neurological: Negative for headaches, focal weakness or numbness.   ____________________________________________   PHYSICAL EXAM:  VITAL SIGNS: ED Triage Vitals  Enc Vitals Group     BP 02/09/18 0918 (!) 155/75     Pulse Rate 02/09/18 0918 87     Resp 02/09/18 0918 18     Temp 02/09/18 0918 97.9 F (36.6 C)     Temp Source 02/09/18 0918 Oral     SpO2 02/09/18 0918 99 %     Weight 02/09/18 0918 157 lb (71.2 kg)     Height 02/09/18 0918 5\' 7"  (1.702 m)     Head Circumference --      Peak Flow --      Pain Score 02/09/18 0942 0      Pain Loc --      Pain Edu? --      Excl. in GC? --    Constitutional: Alert and oriented. Well appearing and in no acute distress. NeckNo cervical spine tenderness to palpation. Hematological/Lymphatic/Immunilogical: No cervical lymphadenopathy. Cardiovascular: Normal rate, regular rhythm. Grossly normal heart sounds.  Good peripheral circulation. Respiratory: Normal respiratory effort.  No retractions. Lungs CTAB. Musculoskeletal: No obvious deformity to the right hand.  Patient is full and equal range of motion.  No noticeable grip strength deficiency in comparison with the left hand.  Patient has mild to moderate guarding at the medial epicondylar area.  neurologic:  Normal speech and language. No gross focal neurologic deficits are appreciated. No gait instability. Skin:  Skin is warm, dry and intact. No rash noted. Psychiatric: Mood and affect are normal. Speech and behavior are normal.  ____________________________________________   LABS (all labs ordered are listed, but only abnormal results are displayed)  Labs Reviewed - No data to display ____________________________________________  EKG   ____________________________________________  RADIOLOGY  ED MD interpretation:    Official radiology report(s): No results found.  ____________________________________________   PROCEDURES  Procedure(s) performed: None  Procedures  Critical Care performed: No  ____________________________________________   INITIAL IMPRESSION / ASSESSMENT AND PLAN / ED COURSE  As part of my medical decision making, I reviewed the following data within the electronic MEDICAL RECORD NUMBER    Decreased grip strength secondary to medial epicondylitis.  Patient given discharge care instruction.  Patient given prescription for Medrol Dosepak.  Patient to follow-up with orthopedic for definitive evaluation treatment.      ____________________________________________   FINAL CLINICAL  IMPRESSION(S) / ED DIAGNOSES  Final diagnoses:  Medial epicondylitis of right elbow     ED Discharge Orders         Ordered    predniSONE (STERAPRED UNI-PAK 21 TAB) 10 MG (21) TBPK tablet  Daily     02/09/18 1025           Note:  This document was prepared using Dragon voice recognition software and may include unintentional dictation errors.    Joni ReiningSmith, Jazzmyn Filion K, PA-C 02/09/18 1034    Emily FilbertWilliams, Jonathan E, MD 02/09/18 1058

## 2018-02-09 NOTE — ED Triage Notes (Signed)
Says she got up and checked phone about 2am and noticed her right hand had trouble gripping it.  This am she continues to have the numbness in the hand.  It is not in the thumb, just the other 4 fingers.  She has no other symptoms.  No facial droop and no weakness of legs or arms.

## 2018-03-31 ENCOUNTER — Other Ambulatory Visit: Payer: Self-pay | Admitting: Internal Medicine

## 2018-03-31 ENCOUNTER — Encounter: Payer: BC Managed Care – PPO | Admitting: Internal Medicine

## 2018-03-31 DIAGNOSIS — F329 Major depressive disorder, single episode, unspecified: Secondary | ICD-10-CM

## 2018-03-31 DIAGNOSIS — F32A Depression, unspecified: Secondary | ICD-10-CM

## 2018-03-31 MED ORDER — BUPROPION HCL ER (XL) 150 MG PO TB24: 150.0000 mg | ORAL_TABLET | Freq: Every day | ORAL | 3 refills | Status: DC

## 2018-04-06 ENCOUNTER — Ambulatory Visit (INDEPENDENT_AMBULATORY_CARE_PROVIDER_SITE_OTHER): Payer: BC Managed Care – PPO | Admitting: Internal Medicine

## 2018-04-06 ENCOUNTER — Encounter: Payer: Self-pay | Admitting: Internal Medicine

## 2018-04-06 VITALS — BP 110/62 | HR 71 | Temp 97.6°F | Ht 67.0 in | Wt 159.4 lb

## 2018-04-06 DIAGNOSIS — Z0000 Encounter for general adult medical examination without abnormal findings: Secondary | ICD-10-CM

## 2018-04-06 DIAGNOSIS — Z13220 Encounter for screening for lipoid disorders: Secondary | ICD-10-CM

## 2018-04-06 DIAGNOSIS — Z1329 Encounter for screening for other suspected endocrine disorder: Secondary | ICD-10-CM | POA: Diagnosis not present

## 2018-04-06 DIAGNOSIS — Z1231 Encounter for screening mammogram for malignant neoplasm of breast: Secondary | ICD-10-CM | POA: Diagnosis not present

## 2018-04-06 DIAGNOSIS — Z1389 Encounter for screening for other disorder: Secondary | ICD-10-CM

## 2018-04-06 NOTE — Patient Instructions (Addendum)
Results for [name], [name] (MRN [id number]) as of 04/06/2018 13:05  Ref. Range 01/25/2018 08:30  Rubella Latest Units: index 17.30  Hepatitis C Ab Latest Ref Range: NON-REACTI  NON-REACTIVE  SIGNAL TO CUT-OFF Latest Ref Range: <1.00  0.02  Mumps IgG Latest Units: AU/mL 285.00  Rubeola IgG Latest Units: AU/mL 14.70 (L)   Consider MMR vaccine again check CDC website   Call back if you want to do cologuard  Call to schedule mammogram

## 2018-04-06 NOTE — Progress Notes (Signed)
Chief Complaint  Patient presents with  . Annual Exam   Annual doing well  1. Ear wax resolved b/l ears  2. Right elbow medial epicondylitis improved with steroids h/o lateral epicondylitis  3. Sinusitis resolved doing better   Review of Systems  Constitutional: Negative for weight loss.  HENT: Negative for hearing loss.   Eyes: Negative for blurred vision.  Respiratory: Negative for shortness of breath.   Cardiovascular: Negative for chest pain.  Gastrointestinal: Negative for abdominal pain.  Musculoskeletal: Negative for falls.  Skin: Negative for rash.  Neurological: Negative for headaches.  Psychiatric/Behavioral: Negative for depression.   Past Medical History:  Diagnosis Date  . Family history of breast cancer    mother and m. aunt   . Lateral epicondylitis   . Medial epicondylitis   . Rosacea    Past Surgical History:  Procedure Laterality Date  . WISDOM TOOTH EXTRACTION     Family History  Problem Relation Age of Onset  . Arthritis Mother   . Cancer Mother        breast   . Breast cancer Mother 39  . Cancer Father        prostate, kidney  . Alcohol abuse Sister   . Cancer Sister   . Early death Sister   . Cancer Maternal Aunt        breast   . Breast cancer Maternal Aunt 37  . Arthritis Sister    Social History   Socioeconomic History  . Marital status: Married    Spouse name: Not on file  . Number of children: Not on file  . Years of education: Not on file  . Highest education level: Not on file  Occupational History  . Not on file  Social Needs  . Financial resource strain: Not on file  . Food insecurity:    Worry: Not on file    Inability: Not on file  . Transportation needs:    Medical: Not on file    Non-medical: Not on file  Tobacco Use  . Smoking status: Never Smoker  . Smokeless tobacco: Never Used  Substance and Sexual Activity  . Alcohol use: No    Frequency: Never  . Drug use: No  . Sexual activity: Not on file  Lifestyle   . Physical activity:    Days per week: Not on file    Minutes per session: Not on file  . Stress: Not on file  Relationships  . Social connections:    Talks on phone: Not on file    Gets together: Not on file    Attends religious service: Not on file    Active member of club or organization: Not on file    Attends meetings of clubs or organizations: Not on file    Relationship status: Not on file  . Intimate partner violence:    Fear of current or ex partner: Not on file    Emotionally abused: Not on file    Physically abused: Not on file    Forced sexual activity: Not on file  Other Topics Concern  . 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 buPROPion (WELLBUTRIN XL) 150 MG 24 hr tablet Take 1 tablet (150 mg total) by mouth daily.  . Calcium Carb-Cholecalciferol (CALCIUM 600-D PO) Take 1 tablet by mouth daily.  . Cholecalciferol (VITAMIN D-3) 1000  units CAPS Take 1 capsule by mouth daily.  . fluticasone (FLONASE) 50 MCG/ACT nasal spray Place 2 sprays into both nostrils daily.  . Omega-3 Fatty Acids (FISH OIL) 1200 MG CAPS Take 1 capsule by mouth daily.  . [DISCONTINUED] azithromycin (ZITHROMAX) 250 MG tablet 2 pills today, 1 pill day 2-5  . [DISCONTINUED] predniSONE (STERAPRED UNI-PAK 21 TAB) 10 MG (21) TBPK tablet Take by mouth daily.   No Known Allergies Recent Results (from the past 2160 hour(s))  Urinalysis, Routine w reflex microscopic     Status: None   Collection Time: 01/25/18  8:30 AM  Result Value Ref Range   Specific Gravity, UA 1.013 1.005 - 1.030   pH, UA 5.5 5.0 - 7.5   Color, UA Yellow Yellow   Appearance Ur Clear Clear   Leukocytes, UA Negative Negative   Protein, UA Negative Negative/Trace   Glucose, UA Negative Negative   Ketones, UA Negative Negative   RBC, UA Negative Negative   Bilirubin, UA Negative Negative   Urobilinogen, Ur 0.2 0.2 - 1.0 mg/dL   Nitrite, UA  Negative Negative   Microscopic Examination Comment     Comment: Microscopic not indicated and not performed.  Comprehensive metabolic panel     Status: None   Collection Time: 01/25/18  8:30 AM  Result Value Ref Range   Glucose, Bld 84 65 - 99 mg/dL    Comment: .            Fasting reference interval .    BUN 17 7 - 25 mg/dL   Creat 0.89 0.50 - 0.99 mg/dL    Comment: For patients >34 years of age, the reference limit for Creatinine is approximately 13% higher for people identified as African-American. .    BUN/Creatinine Ratio NOT APPLICABLE 6 - 22 (calc)   Sodium 141 135 - 146 mmol/L   Potassium 4.7 3.5 - 5.3 mmol/L   Chloride 104 98 - 110 mmol/L   CO2 28 20 - 32 mmol/L   Calcium 9.6 8.6 - 10.4 mg/dL   Total Protein 7.0 6.1 - 8.1 g/dL   Albumin 4.0 3.6 - 5.1 g/dL   Globulin 3.0 1.9 - 3.7 g/dL (calc)   AG Ratio 1.3 1.0 - 2.5 (calc)   Total Bilirubin 0.8 0.2 - 1.2 mg/dL   Alkaline phosphatase (APISO) 118 33 - 130 U/L   AST 22 10 - 35 U/L   ALT 16 6 - 29 U/L  CBC with Differential/Platelet     Status: None   Collection Time: 01/25/18  8:30 AM  Result Value Ref Range   WBC 5.0 3.8 - 10.8 Thousand/uL   RBC 4.47 3.80 - 5.10 Million/uL   Hemoglobin 14.1 11.7 - 15.5 g/dL   HCT 41.4 35.0 - 45.0 %   MCV 92.6 80.0 - 100.0 fL   MCH 31.5 27.0 - 33.0 pg   MCHC 34.1 32.0 - 36.0 g/dL   RDW 12.1 11.0 - 15.0 %   Platelets 358 140 - 400 Thousand/uL   MPV 9.7 7.5 - 12.5 fL   Neutro Abs 2,085 1,500 - 7,800 cells/uL   Lymphs Abs 2,055 850 - 3,900 cells/uL   Absolute Monocytes 375 200 - 950 cells/uL   Eosinophils Absolute 415 15 - 500 cells/uL   Basophils Absolute 70 0 - 200 cells/uL   Neutrophils Relative % 41.7 %   Total Lymphocyte 41.1 %   Monocytes Relative 7.5 %   Eosinophils Relative 8.3 %   Basophils Relative 1.4 %  TSH     Status: None   Collection Time: 01/25/18  8:30 AM  Result Value Ref Range   TSH 2.26 0.40 - 4.50 mIU/L  Measles/Mumps/Rubella Immunity     Status:  Abnormal   Collection Time: 01/25/18  8:30 AM  Result Value Ref Range   Rubeola IgG 14.70 (L) AU/mL    Comment: AU/mL            Interpretation -----            -------------- <13.50           Negative 13.50-16.49      Equivocal >16.49           Positive . A positive result indicates that the patient has antibody to measles virus. It does not differentiate  between an active or past infection. The clinical  diagnosis must be interpreted in conjunction with  clinical signs and symptoms of the patient.    Mumps IgG 285.00 AU/mL    Comment:  AU/mL           Interpretation -------         ---------------- <9.00             Negative 9.00-10.99        Equivocal >10.99            Positive A positive result indicates that the patient has  antibody to mumps virus. It does not differentiate between an  active or past infection. The clinical diagnosis must be interpreted in conjunction with clinical signs and symptoms of the patient. .    Rubella 17.30 index    Comment:     Index            Interpretation     -----            --------------       <0.90            Not consistent with Immunity     0.90-0.99        Equivocal     > or = 1.00      Consistent with Immunity  . The presence of rubella IgG antibody suggests  immunization or past or current infection with rubella virus.   Hepatitis C antibody     Status: None   Collection Time: 01/25/18  8:30 AM  Result Value Ref Range   Hepatitis C Ab NON-REACTIVE NON-REACTI   SIGNAL TO CUT-OFF 0.02 <1.00    Comment: . HCV antibody was non-reactive. There is no laboratory  evidence of HCV infection. . In most cases, no further action is required. However, if recent HCV exposure is suspected, a test for HCV RNA (test code [phone]) is suggested. . For additional information please refer to http://education.[facility].com/faq/FAQ22v1 (This link is being provided for informational/ educational purposes only.) .   Lipid panel      Status: Abnormal   Collection Time: 01/25/18  8:30 AM  Result Value Ref Range   Cholesterol 176 <200 mg/dL   HDL 50 (L) >50 mg/dL   Triglycerides 94 <150 mg/dL   LDL Cholesterol (Calc) 107 (H) mg/dL (calc)    Comment: Reference range: <100 . Desirable range <100 mg/dL for primary prevention;   <70 mg/dL for patients with CHD or diabetic patients  with > or = 2 CHD risk factors. Marland Kitchen LDL-C is now calculated using the Martin-Hopkins  calculation, which is a validated novel method providing  better accuracy than the Friedewald equation in the  estimation of LDL-C.  Cresenciano Genre et al. Annamaria Helling. 7867;672(09): 2061-2068  (http://education.[facility].com/faq/FAQ164)    Total CHOL/HDL Ratio 3.5 <5.0 (calc)   Non-HDL Cholesterol (Calc) 126 <130 mg/dL (calc)    Comment: For patients with diabetes plus 1 major ASCVD risk  factor, treating to a non-HDL-C goal of <100 mg/dL  (LDL-C of <70 mg/dL) is considered a therapeutic  option.   Vitamin D (25 hydroxy)     Status: None   Collection Time: 01/25/18  8:30 AM  Result Value Ref Range   Vit D, 25-Hydroxy 39 30 - 100 ng/mL    Comment: Vitamin D Status         25-OH Vitamin D: . Deficiency:                    <20 ng/mL Insufficiency:             20 - 29 ng/mL Optimal:                 > or = 30 ng/mL . For 25-OH Vitamin D testing on patients on  D2-supplementation and patients for whom quantitation  of D2 and D3 fractions is required, the QuestAssureD(TM) 25-OH VIT D, (D2,D3), LC/MS/MS is recommended: order  code 215-675-4169 (patients >11yr). . For more information on this test, go to: http://education.[facility].com/faq/FAQ163 (This link is being provided for  informational/educational purposes only.)    Objective  Body mass index is 24.97 kg/m. Wt Readings from Last 3 Encounters:  04/06/18 159 lb 6.4 oz (72.3 kg)  02/09/18 157 lb (71.2 kg)  01/19/18 157 lb (71.2 kg)   Temp Readings from Last 3 Encounters:  04/06/18 97.6 F  (36.4 C) (Oral)  02/09/18 97.9 F (36.6 C) (Oral)  01/19/18 98.5 F (36.9 C) (Oral)   BP Readings from Last 3 Encounters:  04/06/18 110/62  02/09/18 (!) 155/75  01/19/18 112/62   Pulse Readings from Last 3 Encounters:  04/06/18 71  02/09/18 87  01/19/18 74    Physical Exam Vitals signs and nursing note reviewed.  Constitutional:      Appearance: Normal appearance. She is well-developed and well-groomed.  HENT:     Head: Normocephalic and atraumatic.     Ears:     Comments: Mild cerumen b/l ears     Nose: Nose normal.     Mouth/Throat:     Mouth: Mucous membranes are moist.     Pharynx: Oropharynx is clear.  Eyes:     Conjunctiva/sclera: Conjunctivae normal.     Pupils: Pupils are equal, round, and reactive to light.  Cardiovascular:     Rate and Rhythm: Normal rate and regular rhythm.     Heart sounds: Normal heart sounds. No murmur.  Pulmonary:     Effort: Pulmonary effort is normal.     Breath sounds: Normal breath sounds.  Chest:     Chest wall: No mass.     Breasts: Breasts are symmetrical.        Right: Normal. No swelling, bleeding, inverted nipple, mass, nipple discharge, skin change or tenderness.        Left: Normal. No swelling, bleeding, inverted nipple, mass, nipple discharge, skin change or tenderness.  Musculoskeletal:     Right elbow: She exhibits normal range of motion and no swelling. No tenderness found. No medial epicondyle, no lateral epicondyle and no olecranon process tenderness noted.     Right lower leg: No edema.     Left lower leg: No edema.  Lymphadenopathy:  Upper Body:     Right upper body: No axillary adenopathy.     Left upper body: No axillary adenopathy.  Skin:    General: Skin is warm and dry.  Neurological:     General: No focal deficit present.     Mental Status: She is alert and oriented to person, place, and time. Mental status is at baseline.     Gait: Gait normal.  Psychiatric:        Attention and Perception:  Attention and perception normal.        Mood and Affect: Mood and affect normal.        Behavior: Behavior normal.        Thought Content: Thought content normal.        Cognition and Memory: Cognition and memory normal.        Judgment: Judgment normal.     Assessment   1. Annual  Plan  utd flu shot  Tdap had 01/16/16 shingrix had 2/2 doses  rec MMR vaccine  Hep B immune  Hep C neg sch fasting labs 01/2019   Pap 12 21/18 neg neg HPV cervical polyp removed OB/GYN Need to get copy colonoscopy 06/29/07 [facility]  -wants to hold on referral colonoscopy again today wants to do cologuard will call insurance with price  mammo 04/14/17 neg referred today breast exam today Never smoker  Consider DEXA age 26 Consider healthy diet choices and exercise  D3 2000 IU daily   Provider: Dr. Olivia Mackie McLean-Scocuzza-Internal Medicine

## 2018-04-06 NOTE — Progress Notes (Signed)
Pre visit review using our clinic review tool, if applicable. No additional management support is needed unless otherwise documented below in the visit note. 

## 2018-04-07 ENCOUNTER — Telehealth: Payer: Self-pay | Admitting: *Deleted

## 2018-04-07 NOTE — Telephone Encounter (Signed)
Copied from CRM [id number]. Topic: General - Other >> Apr 07, 2018 10:19 AM Elliot Gault wrote: Relation to pt: self  Call back number: [phone]    Reason for call: Patient wanted to inform PCP cologuard is covered 100%

## 2018-04-07 NOTE — Telephone Encounter (Signed)
Form filled out ready to fax

## 2018-04-10 NOTE — Telephone Encounter (Signed)
Form faxed

## 2018-04-17 ENCOUNTER — Ambulatory Visit
Admission: RE | Admit: 2018-04-17 | Discharge: 2018-04-17 | Disposition: A | Discharge status: Home / Self Care | Payer: BC Managed Care – PPO | Source: Ambulatory Visit | Attending: Internal Medicine | Admitting: Internal Medicine

## 2018-04-17 DIAGNOSIS — Z1231 Encounter for screening mammogram for malignant neoplasm of breast: Secondary | ICD-10-CM | POA: Diagnosis present

## 2018-04-24 LAB — COLOGUARD: COLOGUARD: NEGATIVE

## 2018-04-28 ENCOUNTER — Telehealth: Payer: Self-pay | Admitting: Internal Medicine

## 2018-04-28 ENCOUNTER — Encounter: Payer: Self-pay | Admitting: Internal Medicine

## 2018-04-28 NOTE — Telephone Encounter (Signed)
Dr. Lawerance Cruel cologuard is negative will repeat in 3 years   TMS

## 2018-04-28 NOTE — Telephone Encounter (Signed)
Left message for patient to return call back. [facility] may give results.  

## 2018-05-01 NOTE — Telephone Encounter (Signed)
Patient returned call- notified of her results and follow up

## 2018-12-27 ENCOUNTER — Other Ambulatory Visit: Payer: Self-pay | Admitting: Internal Medicine

## 2018-12-27 DIAGNOSIS — F32A Depression, unspecified: Secondary | ICD-10-CM

## 2018-12-27 DIAGNOSIS — F329 Major depressive disorder, single episode, unspecified: Secondary | ICD-10-CM

## 2018-12-27 MED ORDER — BUPROPION HCL ER (XL) 150 MG PO TB24: 150.0000 mg | ORAL_TABLET | Freq: Every day | ORAL | 1 refills | Status: DC

## 2019-01-26 ENCOUNTER — Other Ambulatory Visit: Payer: BC Managed Care – PPO

## 2019-01-30 ENCOUNTER — Ambulatory Visit: Payer: BC Managed Care – PPO | Admitting: Internal Medicine

## 2019-03-12 ENCOUNTER — Ambulatory Visit: Payer: BC Managed Care – PPO | Attending: Internal Medicine

## 2019-03-12 DIAGNOSIS — Z20822 Contact with and (suspected) exposure to covid-19: Secondary | ICD-10-CM

## 2019-03-13 LAB — NOVEL CORONAVIRUS, NAA: SARS-CoV-2, NAA: NOT DETECTED

## 2019-03-14 ENCOUNTER — Telehealth: Payer: Self-pay | Admitting: Internal Medicine

## 2019-03-14 ENCOUNTER — Ambulatory Visit (INDEPENDENT_AMBULATORY_CARE_PROVIDER_SITE_OTHER): Payer: BC Managed Care – PPO | Admitting: Family Medicine

## 2019-03-14 ENCOUNTER — Encounter: Payer: Self-pay | Admitting: Family Medicine

## 2019-03-14 DIAGNOSIS — R059 Cough, unspecified: Secondary | ICD-10-CM | POA: Insufficient documentation

## 2019-03-14 DIAGNOSIS — R05 Cough: Secondary | ICD-10-CM

## 2019-03-14 NOTE — Telephone Encounter (Signed)
Will need to resch labs and f/u in person upcoming 03/2019 per protocol   Rec tylenol  Increase with fluid  Zinc 100 mg daily  Vitamin C 1000 mg daily  Vitamin D 4000 IU daily  Quercetin 250-500 mg 2x per day  Mucinex dm green label  Elderberry  Honey/lemon cough drops  Warm tea with ginger/honey/lemon If she has a pulse oximeter monitor o2  Brat diet/broths bland foods    Ok noted getting covid retested  If not better let me know Friday am    TMS

## 2019-03-14 NOTE — Assessment & Plan Note (Addendum)
With fever  covid test neg Monday  I urged her to get tested one more time -she agrees and will do so at [facility]  If influenza - it is 5 days in and too late for antiviral  Treating fever with nsaid We discussed trial of acetaminophen  Fluids/rest Isolate until symptoms resolve inst to call if worse or new symptoms develop  Also inst to call us with result from covid test  She voiced understanding  Would consider resp clinic visit if worse or no improvement

## 2019-03-14 NOTE — Telephone Encounter (Signed)
FYI, see office note from visit with Dr. Milinda Antis this morning. Pt has been scheduled for another COVID test on 03/15/19

## 2019-03-14 NOTE — Patient Instructions (Addendum)
Drink fluids and rest Try acetaminophen (tylenol) for fever or pain  Robitussin or other expectorant if cough becomes productive Rest/fluids and isolation  Go ahead and get tested for covid one more time as well   Update if not starting to improve in a week or if worsening   If new symptoms please alert [place] as well

## 2019-03-14 NOTE — Telephone Encounter (Signed)
Pt called in and has a fever today of 99.4, coughing, no appetite, fatigued. Pt had a negative covid test. We have no availability today called [facility] and spoke with Toni Amend and she got her scheduled with Dr. Milinda Antis @ 10:45am virtual appt.

## 2019-03-14 NOTE — Progress Notes (Signed)
Virtual Visit via Video Note  I connected with Misty Rojas on 03/14/19 at 10:45 AM EST by a video enabled telemedicine application and verified that I am speaking with the correct person using two identifiers.  Location: Patient: home Provider: office   I discussed the limitations of evaluation and management by telemedicine and the availability of in person appointments. The patient expressed understanding and agreed to proceed.  Parties involved in encounter  Patient: Misty Rojas  Provider:  Roxy Manns MD    History of Present Illness: Pt presents with fever  Called today with temp of 99.4   Now 100.0  102 last night    Started over a week ago- dry cough and no appetite  Saturday - fever  covid test on Monday was negative (at [facility] )   A little appetite  Lethargic  Some GI upset - a little nausea now and then  No diarrhea  slt indigestion  No loss of taste or smell   Headache- just a little when temp goes up  Ears-fine Throat -fine  No nasal congestion  No sob  Cough is dry and hacky    , no wheezing  Not keeping her up at night   No rash   She had a flu shot this year    OTC: taking ibuprofen and naproxen  No tylenol  Took robitussin last week-not too helpful   No sick contacts  She stays in   Sees her kids and grand kids -none of them have been sick   Yesterday grandson ran a fever/ he is ok today  He did not get tested or go to doctor    Patient Active Problem List   Diagnosis Date Noted  . Cough 03/14/2019  . Pansinusitis 01/19/2018  . Bilateral impacted cerumen 01/19/2018  . Family history of breast cancer 01/28/2017  . Rosacea 01/28/2017  . Medial epicondylitis 01/28/2017  . Lateral epicondylitis 01/28/2017  . Annual physical exam 01/28/2017   Past Medical History:  Diagnosis Date  . Family history of breast cancer    mother and m. aunt   . Lateral epicondylitis   . Medial epicondylitis   . Medial epicondylitis    right   .  Rosacea    Past Surgical History:  Procedure Laterality Date  . WISDOM TOOTH EXTRACTION     Social History   Tobacco Use  . Smoking status: Never Smoker  . Smokeless tobacco: Never Used  Substance Use Topics  . Alcohol use: No  . Drug use: No   Family History  Problem Relation Age of Onset  . Arthritis Mother   . Cancer Mother        breast   . Breast cancer Mother 26  . Cancer Father        prostate, kidney  . Alcohol abuse Sister   . Cancer Sister   . Early death Sister   . Cancer Maternal Aunt        breast   . Breast cancer Maternal Aunt 40  . Arthritis Sister    No Known Allergies Current Outpatient Medications on File Prior to Visit  Medication Sig Dispense Refill  . buPROPion (WELLBUTRIN XL) 150 MG 24 hr tablet Take 1 tablet (150 mg total) by mouth daily. appt further refills after 04/07/2019 90 tablet 1  . Cholecalciferol (VITAMIN D-3) 1000 units CAPS Take 2 capsules by mouth daily.     . fluticasone (FLONASE) 50 MCG/ACT nasal spray Place 2 sprays into both nostrils  daily. (Patient taking differently: Place 2 sprays into both nostrils daily as needed. ) 16 g 6  . Omega-3 Fatty Acids (FISH OIL) 1200 MG CAPS Take 1 capsule by mouth daily.     No current facility-administered medications on file prior to visit.   Review of Systems  Constitutional: Positive for chills, fever and malaise/fatigue.  HENT: Negative for congestion, ear pain, sinus pain and sore throat.   Eyes: Negative for blurred vision, discharge and redness.  Respiratory: Positive for cough. Negative for sputum production, shortness of breath, wheezing and stridor.   Cardiovascular: Negative for chest pain, palpitations and leg swelling.  Gastrointestinal: Negative for abdominal pain, diarrhea, nausea and vomiting.  Musculoskeletal: Negative for myalgias.  Skin: Negative for rash.  Neurological: Positive for headaches. Negative for dizziness.    Observations/Objective: Patient appears well, in no  distress Weight is baseline  No facial swelling or asymmetry Normal voice-not hoarse and no slurred speech No obvious tremor or mobility impairment Moving neck and UEs normally Able to hear the call well  occ dry cough during interview without wheezing or sob Talkative and mentally sharp with no cognitive changes No skin changes on face or neck , no rash or pallor Affect is normal    Assessment and Plan: Problem List Items Addressed This Visit      Other   Cough    With fever  covid test neg Monday  I urged her to get tested one more time -she agrees and will do so at [facility]  If influenza - it is 5 days in and too late for antiviral  Treating fever with nsaid We discussed trial of acetaminophen  Fluids/rest Isolate until symptoms resolve inst to call if worse or new symptoms develop  Also inst to call us with result from covid test  She voiced understanding  Would consider resp clinic visit if worse or no improvement          Follow Up Instructions: Drink fluids and rest Try acetaminophen (tylenol) for fever or pain  Robitussin or other expectorant if cough becomes productive Rest/fluids and isolation  Go ahead and get tested for covid one more time as well   Update if not starting to improve in a week or if worsening   If new symptoms please alert [place] as well    I discussed the assessment and treatment plan with the patient. The patient was provided an opportunity to ask questions and all were answered. The patient agreed with the plan and demonstrated an understanding of the instructions.   The patient was advised to call back or seek an in-person evaluation if the symptoms worsen or if the condition fails to improve as anticipated.  Loura Pardon, MD

## 2019-03-15 ENCOUNTER — Ambulatory Visit: Payer: BC Managed Care – PPO | Attending: Internal Medicine

## 2019-03-15 DIAGNOSIS — Z20822 Contact with and (suspected) exposure to covid-19: Secondary | ICD-10-CM

## 2019-03-15 NOTE — Telephone Encounter (Signed)
I spoke with patient & she rescheduled for labs 21 days out. She also asked if I would send Dr. Shauna Hugh recommendations via mychart since she was driving & I have done so.

## 2019-03-16 LAB — NOVEL CORONAVIRUS, NAA: SARS-CoV-2, NAA: NOT DETECTED

## 2019-03-26 ENCOUNTER — Other Ambulatory Visit: Payer: BC Managed Care – PPO

## 2019-04-10 ENCOUNTER — Encounter: Payer: BC Managed Care – PPO | Admitting: Internal Medicine

## 2019-04-17 ENCOUNTER — Other Ambulatory Visit (INDEPENDENT_AMBULATORY_CARE_PROVIDER_SITE_OTHER): Payer: BC Managed Care – PPO

## 2019-04-17 ENCOUNTER — Other Ambulatory Visit: Payer: Self-pay

## 2019-04-17 DIAGNOSIS — Z1329 Encounter for screening for other suspected endocrine disorder: Secondary | ICD-10-CM

## 2019-04-17 DIAGNOSIS — Z1389 Encounter for screening for other disorder: Secondary | ICD-10-CM | POA: Diagnosis not present

## 2019-04-17 DIAGNOSIS — Z13220 Encounter for screening for lipoid disorders: Secondary | ICD-10-CM

## 2019-04-17 DIAGNOSIS — Z0000 Encounter for general adult medical examination without abnormal findings: Secondary | ICD-10-CM | POA: Diagnosis not present

## 2019-04-17 LAB — CBC WITH DIFFERENTIAL/PLATELET
Basophils Absolute: 0 10*3/uL (ref 0.0–0.1)
Basophils Relative: 0.9 % (ref 0.0–3.0)
Eosinophils Absolute: 0.5 10*3/uL (ref 0.0–0.7)
Eosinophils Relative: 9.8 % — ABNORMAL HIGH (ref 0.0–5.0)
HCT: 40.2 % (ref 36.0–46.0)
Hemoglobin: 13.6 g/dL (ref 12.0–15.0)
Lymphocytes Relative: 41.6 % (ref 12.0–46.0)
Lymphs Abs: 2.2 10*3/uL (ref 0.7–4.0)
MCHC: 33.8 g/dL (ref 30.0–36.0)
MCV: 94 fl (ref 78.0–100.0)
Monocytes Absolute: 0.4 10*3/uL (ref 0.1–1.0)
Monocytes Relative: 8.3 % (ref 3.0–12.0)
Neutro Abs: 2.1 10*3/uL (ref 1.4–7.7)
Neutrophils Relative %: 39.4 % — ABNORMAL LOW (ref 43.0–77.0)
Platelets: 267 10*3/uL (ref 150.0–400.0)
RBC: 4.28 Mil/uL (ref 3.87–5.11)
RDW: 13.8 % (ref 11.5–15.5)
WBC: 5.2 10*3/uL (ref 4.0–10.5)

## 2019-04-17 LAB — COMPREHENSIVE METABOLIC PANEL
ALT: 19 U/L (ref 0–35)
AST: 24 U/L (ref 0–37)
Albumin: 4.2 g/dL (ref 3.5–5.2)
Alkaline Phosphatase: 121 U/L — ABNORMAL HIGH (ref 39–117)
BUN: 18 mg/dL (ref 6–23)
CO2: 30 mEq/L (ref 19–32)
Calcium: 9.6 mg/dL (ref 8.4–10.5)
Chloride: 105 mEq/L (ref 96–112)
Creatinine, Ser: 1.09 mg/dL (ref 0.40–1.20)
GFR: 50.43 mL/min — ABNORMAL LOW (ref >60.00)
Glucose, Bld: 84 mg/dL (ref 70–99)
Potassium: 4.8 mEq/L (ref 3.5–5.1)
Sodium: 140 mEq/L (ref 135–145)
Total Bilirubin: 0.8 mg/dL (ref 0.2–1.2)
Total Protein: 7.5 g/dL (ref 6.0–8.3)

## 2019-04-17 LAB — LIPID PANEL
Cholesterol: 188 mg/dL (ref 0–200)
HDL: 58.2 mg/dL (ref >39.00)
LDL Cholesterol: 116 mg/dL — ABNORMAL HIGH (ref 0–99)
NonHDL: 129.97
Total CHOL/HDL Ratio: 3
Triglycerides: 70 mg/dL (ref 0.0–149.0)
VLDL: 14 mg/dL (ref 0.0–40.0)

## 2019-04-17 LAB — TSH: TSH: 2.83 u[IU]/mL (ref 0.35–4.50)

## 2019-04-17 NOTE — Addendum Note (Signed)
Addended by: Warden Fillers on: 04/17/2019 05:15 PM   Modules accepted: Orders

## 2019-04-19 ENCOUNTER — Ambulatory Visit (INDEPENDENT_AMBULATORY_CARE_PROVIDER_SITE_OTHER): Payer: BC Managed Care – PPO | Admitting: Internal Medicine

## 2019-04-19 ENCOUNTER — Encounter: Payer: Self-pay | Admitting: Internal Medicine

## 2019-04-19 ENCOUNTER — Other Ambulatory Visit: Payer: Self-pay

## 2019-04-19 VITALS — BP 122/76 | HR 72 | Temp 97.0°F | Ht 67.0 in | Wt 154.4 lb

## 2019-04-19 DIAGNOSIS — E2839 Other primary ovarian failure: Secondary | ICD-10-CM

## 2019-04-19 DIAGNOSIS — R7989 Other specified abnormal findings of blood chemistry: Secondary | ICD-10-CM | POA: Diagnosis not present

## 2019-04-19 DIAGNOSIS — N3000 Acute cystitis without hematuria: Secondary | ICD-10-CM | POA: Diagnosis not present

## 2019-04-19 DIAGNOSIS — Z0000 Encounter for general adult medical examination without abnormal findings: Secondary | ICD-10-CM

## 2019-04-19 DIAGNOSIS — N1831 Chronic kidney disease, stage 3a: Secondary | ICD-10-CM | POA: Diagnosis not present

## 2019-04-19 DIAGNOSIS — J324 Chronic pansinusitis: Secondary | ICD-10-CM

## 2019-04-19 DIAGNOSIS — Z1231 Encounter for screening mammogram for malignant neoplasm of breast: Secondary | ICD-10-CM | POA: Diagnosis not present

## 2019-04-19 LAB — URINALYSIS, ROUTINE W REFLEX MICROSCOPIC
Bilirubin Urine: NEGATIVE
Hgb urine dipstick: NEGATIVE
Ketones, ur: NEGATIVE
Nitrite: NEGATIVE
Specific Gravity, Urine: <=1.005 — AB (ref 1.000–1.030)
Total Protein, Urine: NEGATIVE
Urine Glucose: NEGATIVE
Urobilinogen, UA: 0.2 (ref 0.0–1.0)
pH: 5.5 (ref 5.0–8.0)

## 2019-04-19 LAB — BASIC METABOLIC PANEL
BUN: 20 mg/dL (ref 6–23)
CO2: 29 mEq/L (ref 19–32)
Calcium: 9.8 mg/dL (ref 8.4–10.5)
Chloride: 101 mEq/L (ref 96–112)
Creatinine, Ser: 0.97 mg/dL (ref 0.40–1.20)
GFR: 57.69 mL/min — ABNORMAL LOW (ref >60.00)
Glucose, Bld: 89 mg/dL (ref 70–99)
Potassium: 4.3 mEq/L (ref 3.5–5.1)
Sodium: 138 mEq/L (ref 135–145)

## 2019-04-19 MED ORDER — FLUTICASONE PROPIONATE 50 MCG/ACT NA SUSP: 2.0000 | Freq: Every day | NASAL | 11 refills | Status: DC | PRN

## 2019-04-19 NOTE — Progress Notes (Signed)
Chief Complaint  Patient presents with  . Annual Exam   Annual  1. Had URI 03/2019 covid test x 2 neg and abx test negative sx's cough, fever, GI sx's cough lasted 1.5 weeks now resolved  2. Reviewed labs 04/17/19 with elevated Cr. And reduced GFR FH dad with kidney cancer will further w/u today  3. C/o red flat bumps/blood vessels in a circle rash to b/l lower calves new x 1 week no itching or pain or sx's consider derm in future wants to hold for now    Review of Systems  Constitutional: Negative for weight loss.  HENT: Negative for hearing loss.   Eyes: Negative for blurred vision.  Respiratory: Negative for cough and shortness of breath.   Cardiovascular: Negative for chest pain.  Gastrointestinal: Negative for blood in stool.  Musculoskeletal: Negative for falls.  Skin: Positive for rash. Negative for itching.  Neurological: Negative for headaches.  Psychiatric/Behavioral: Negative for depression.   Past Medical History:  Diagnosis Date  . Family history of breast cancer    mother and m. aunt   . Lateral epicondylitis   . Medial epicondylitis   . Medial epicondylitis    right   . Rosacea    Past Surgical History:  Procedure Laterality Date  . WISDOM TOOTH EXTRACTION     Family History  Problem Relation Age of Onset  . Arthritis Mother   . Cancer Mother        breast   . Breast cancer Mother 44  . Cancer Father        prostate, kidney with mets  . Alcohol abuse Sister   . Cancer Sister   . Early death Sister   . Cancer Maternal Aunt        breast   . Breast cancer Maternal Aunt 9  . Arthritis Sister    Social History   Socioeconomic History  . Marital status: Married    Spouse name: Not on file  . Number of children: Not on file  . Years of education: Not on file  . Highest education level: Not on file  Occupational History  . Not on file  Tobacco Use  . Smoking status: Never Smoker  . Smokeless tobacco: Never Used  Substance and Sexual Activity  .  Alcohol use: No  . Drug use: No  . Sexual activity: Not on file  Other Topics Concern  . 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 Difficulty of Paying Living Expenses:   Food Insecurity:   . Worried About Charity fundraiser in the Last Year:   . Arboriculturist in the Last Year:   Transportation Needs:   . Film/video editor (Medical):   Marland Kitchen Lack of Transportation (Non-Medical):   Physical Activity:   . Days of Exercise per Week:   . Minutes of Exercise per Session:   Stress:   . Feeling of Stress :   Social Connections:   . Frequency of Communication with Friends and Family:   . Frequency of Social Gatherings with Friends and Family:   . Attends Religious Services:   . Active Member of Clubs or Organizations:   . Attends Archivist Meetings:   Marland Kitchen Marital Status:   Intimate Partner Violence:   . Fear of Current or Ex-Partner:   .  Emotionally Abused:   Marland Kitchen Physically Abused:   . Sexually Abused:    Current Meds  Medication Sig  . Ascorbic Acid (VITAMIN C PO) Take by mouth.  Marland Kitchen buPROPion (WELLBUTRIN XL) 150 MG 24 hr tablet Take 1 tablet (150 mg total) by mouth daily. appt further refills after 04/07/2019  . Cholecalciferol (VITAMIN D-3) 1000 units CAPS Take 2 capsules by mouth daily.   Marland Kitchen ELDERBERRY PO Take by mouth.  . fluticasone (FLONASE) 50 MCG/ACT nasal spray Place 2 sprays into both nostrils daily as needed.  . Omega-3 Fatty Acids (FISH OIL) 1200 MG CAPS Take 1 capsule by mouth daily.  . [DISCONTINUED] fluticasone (FLONASE) 50 MCG/ACT nasal spray Place 2 sprays into both nostrils daily. (Patient taking differently: Place 2 sprays into both nostrils daily as needed. )   No Known Allergies Recent Results (from the past 2160 hour(s))  Novel Coronavirus, NAA ([facility])     Status: None   Collection Time: 03/12/19 10:03 AM   Specimen:  Oropharyngeal(OP) collection in vial transport medium   OROPHARYNGEA  TESTING  Result Value Ref Range   SARS-CoV-2, NAA Not Detected Not Detected    Comment: This nucleic acid amplification test was developed and its performance characteristics determined by [employer]. Nucleic acid amplification tests include RT-PCR and TMA. This test has not been FDA cleared or approved. This test has been authorized by FDA under an Emergency Use Authorization (EUA). This test is only authorized for the duration of time the declaration that circumstances exist justifying the authorization of the emergency use of in vitro diagnostic tests for detection of SARS-CoV-2 virus and/or diagnosis of COVID-19 infection under section 564(b)(1) of the Act, 21 U.S.C. 250NLZ-7(Q) (1), unless the authorization is terminated or revoked sooner. When diagnostic testing is negative, the possibility of a false negative result should be considered in the context of a patient's recent exposures and the presence of clinical signs and symptoms consistent with COVID-19. An individual without symptoms of COVID-19 and who is not shedding SARS-CoV-2 virus wo uld expect to have a negative (not detected) result in this assay.   Novel Coronavirus, NAA ([facility])     Status: None   Collection Time: 03/15/19  2:18 PM   Specimen: Nasopharyngeal(NP) swabs in vial transport medium   NASOPHARYNGE  TESTING  Result Value Ref Range   SARS-CoV-2, NAA Not Detected Not Detected    Comment: Testing was performed using the cobas(R) SARS-CoV-2 test. This nucleic acid amplification test was developed and its performance characteristics determined by [employer], [employer]. Nucleic acid amplification tests include RT-PCR and TMA. This test has not been FDA cleared or approved. This test has been authorized by FDA under an Emergency Use Authorization (EUA). This test is only authorized for the duration of time the declaration that  circumstances exist justifying the authorization of the emergency use of in vitro diagnostic tests for detection of SARS-CoV-2 virus and/or diagnosis of COVID-19 infection under section 564(b)(1) of the Act, 21 U.S.C. 734LPF-7(T) (1), unless the authorization is terminated or revoked sooner. When diagnostic testing is negative, the possibility of a false negative result should be considered in the context of a patient's recent exposures and the presence of clinical signs and symptoms consistent with COVID-19. An individual without sympto ms of COVID-19 and who is not shedding SARS-CoV-2 virus would expect to have a negative (not detected) result in this assay.   TSH     Status: None   Collection Time: 04/17/19  9:43 AM  Result Value  Ref Range   TSH 2.83 0.35 - 4.50 uIU/mL  Lipid panel     Status: Abnormal   Collection Time: 04/17/19  9:43 AM  Result Value Ref Range   Cholesterol 188 0 - 200 mg/dL    Comment: ATP III Classification       Desirable:  < 200 mg/dL               Borderline High:  200 - 239 mg/dL          High:  > = 240 mg/dL   Triglycerides 70.0 0.0 - 149.0 mg/dL    Comment: Normal:  <150 mg/dLBorderline High:  150 - 199 mg/dL   HDL 58.20 >39.00 mg/dL   VLDL 14.0 0.0 - 40.0 mg/dL   LDL Cholesterol 116 (H) 0 - 99 mg/dL   Total CHOL/HDL Ratio 3     Comment:                Men          Women1/2 Average Risk     3.4          3.3Average Risk          5.0          4.42X Average Risk          9.6          7.13X Average Risk          15.0          11.0                       NonHDL 129.97     Comment: NOTE:  Non-HDL goal should be 30 mg/dL higher than patient's LDL goal (i.e. LDL goal of < 70 mg/dL, would have non-HDL goal of < 100 mg/dL)  CBC with Differential/Platelet     Status: Abnormal   Collection Time: 04/17/19  9:43 AM  Result Value Ref Range   WBC 5.2 4.0 - 10.5 K/uL   RBC 4.28 3.87 - 5.11 Mil/uL   Hemoglobin 13.6 12.0 - 15.0 g/dL   HCT 40.2 36.0 - 46.0 %   MCV 94.0  78.0 - 100.0 fl   MCHC 33.8 30.0 - 36.0 g/dL   RDW 13.8 11.5 - 15.5 %   Platelets 267.0 150.0 - 400.0 K/uL   Neutrophils Relative % 39.4 (L) 43.0 - 77.0 %   Lymphocytes Relative 41.6 12.0 - 46.0 %   Monocytes Relative 8.3 3.0 - 12.0 %   Eosinophils Relative 9.8 (H) 0.0 - 5.0 %   Basophils Relative 0.9 0.0 - 3.0 %   Neutro Abs 2.1 1.4 - 7.7 K/uL   Lymphs Abs 2.2 0.7 - 4.0 K/uL   Monocytes Absolute 0.4 0.1 - 1.0 K/uL   Eosinophils Absolute 0.5 0.0 - 0.7 K/uL   Basophils Absolute 0.0 0.0 - 0.1 K/uL  Comprehensive metabolic panel     Status: Abnormal   Collection Time: 04/17/19  9:43 AM  Result Value Ref Range   Sodium 140 135 - 145 mEq/L   Potassium 4.8 3.5 - 5.1 mEq/L   Chloride 105 96 - 112 mEq/L   CO2 30 19 - 32 mEq/L   Glucose, Bld 84 70 - 99 mg/dL   BUN 18 6 - 23 mg/dL   Creatinine, Ser 1.09 0.40 - 1.20 mg/dL   Total Bilirubin 0.8 0.2 - 1.2 mg/dL   Alkaline Phosphatase 121 (H) 39 - 117 U/L   AST  24 0 - 37 U/L   ALT 19 0 - 35 U/L   Total Protein 7.5 6.0 - 8.3 g/dL   Albumin 4.2 3.5 - 5.2 g/dL   GFR 50.43 (L) >60.00 mL/min   Calcium 9.6 8.4 - 10.5 mg/dL   Objective  Body mass index is 24.18 kg/m. Wt Readings from Last 3 Encounters:  04/19/19 154 lb 6.4 oz (70 kg)  04/06/18 159 lb 6.4 oz (72.3 kg)  02/09/18 157 lb (71.2 kg)   Temp Readings from Last 3 Encounters:  04/19/19 (!) 97 F (36.1 C) (Temporal)  03/14/19 100 F (37.8 C)  04/06/18 97.6 F (36.4 C) (Oral)   BP Readings from Last 3 Encounters:  04/19/19 122/76  04/06/18 110/62  02/09/18 (!) 155/75   Pulse Readings from Last 3 Encounters:  04/19/19 72  04/06/18 71  02/09/18 87    Physical Exam Vitals and nursing note reviewed.  Constitutional:      Appearance: Normal appearance. She is well-developed and well-groomed.  HENT:     Head: Normocephalic and atraumatic.     Comments: Mild wax b/l ears not impacted  Eyes:     Conjunctiva/sclera: Conjunctivae normal.     Pupils: Pupils are equal,  round, and reactive to light.  Cardiovascular:     Rate and Rhythm: Normal rate and regular rhythm.     Heart sounds: Normal heart sounds.  Pulmonary:     Effort: Pulmonary effort is normal.     Breath sounds: Normal breath sounds.  Chest:     Chest wall: No mass.     Breasts: Breasts are symmetrical.        Right: Normal. No swelling, bleeding, inverted nipple, mass, nipple discharge, skin change or tenderness.        Left: Normal. No swelling, bleeding, inverted nipple, mass, nipple discharge, skin change or tenderness.  Abdominal:     General: Abdomen is flat. Bowel sounds are normal.     Tenderness: There is no abdominal tenderness.  Musculoskeletal:     Right lower leg: No edema.     Left lower leg: No edema.  Lymphadenopathy:     Upper Body:     Right upper body: No axillary adenopathy.     Left upper body: No axillary adenopathy.  Skin:    General: Skin is warm.       Neurological:     General: No focal deficit present.     Mental Status: She is alert and oriented to person, place, and time. Mental status is at baseline.     Gait: Gait normal.  Psychiatric:        Attention and Perception: Attention and perception normal.        Mood and Affect: Mood and affect normal.        Speech: Speech normal.        Behavior: Behavior normal. Behavior is cooperative.        Thought Content: Thought content normal.        Cognition and Memory: Cognition and memory normal.        Judgment: Judgment normal.     Assessment  Plan  Annual physical exam utd flu shot Tdap had 01/16/16 shingrix had 2/2 doses rec MMR vaccine covid vx considering  Hep B immune  Hep C neg Reviewed labs 04/17/19   Pap 12 21/18 due 01/2020 neg neg HPV cervical polyp removed OB/GYN cologuard 04/24/2018 negative  repeat in 3 years  mammo3/7/19 negreferred today breast exam today  normal  Never smoker  Consider DEXA ordered with mammogram Consider healthy diet choices and exercise  D3 2000 IU  daily  Derm hold for now as of 04/19/19   Stage 3a chronic kidney disease with elevated Creatinine mildly >1 ? Etiology was doing nsaids in 03/2019 - Plan: Microalbumin / creatinine urine ratio, Antinuclear Antib (ANA), Protein Electrophoresis, (serum), Protein Electrophoresis, Urine Rflx., Urinalysis, Routine w reflex microscopic, Urine Culture, Basic Metabolic Panel (BMET), Sodium, urine, random Consider renal US in future pt wants to hold for now as of 04/19/19   Provider: Dr. Olivia Mackie McLean-Scocuzza-[facility]

## 2019-04-19 NOTE — Patient Instructions (Addendum)
COVID-19 Vaccine Information can be found at: [email or web address] For questions related to vaccine distribution or appointments, please email [email or web address] or call [phone].  COVID 19  [phone] in [place]   [phone] in [place]  [phone] in [place]   [employer] news on website

## 2019-04-21 LAB — MICROALBUMIN / CREATININE URINE RATIO
Creatinine, Urine: 30 mg/dL (ref 20–275)
Microalb Creat Ratio: 7 mcg/mg creat (ref <30)
Microalb, Ur: 0.2 mg/dL

## 2019-04-21 LAB — URINE CULTURE
MICRO NUMBER:: 10240639
SPECIMEN QUALITY:: ADEQUATE

## 2019-04-21 LAB — SODIUM, URINE, RANDOM: Sodium, Ur: 33 mmol/L (ref 28–272)

## 2019-04-23 ENCOUNTER — Encounter: Payer: Self-pay | Admitting: Internal Medicine

## 2019-04-23 ENCOUNTER — Other Ambulatory Visit: Payer: Self-pay | Admitting: Internal Medicine

## 2019-04-23 DIAGNOSIS — N3000 Acute cystitis without hematuria: Secondary | ICD-10-CM

## 2019-04-23 MED ORDER — CIPROFLOXACIN HCL 500 MG PO TABS: 500.0000 mg | ORAL_TABLET | Freq: Two times a day (BID) | ORAL | 0 refills | Status: DC

## 2019-04-24 LAB — PROTEIN ELECTROPHORESIS, URINE REFLEX
Albumin ELP, Urine: 0 %
Alpha-1-Globulin, U: 0 %
Alpha-2-Globulin, U: 0 %
Beta Globulin, U: 0 %
Gamma Globulin, U: 0 %
Protein, Ur: 8.9 mg/dL

## 2019-04-25 LAB — ANTI-NUCLEAR AB-TITER (ANA TITER): ANA Titer 1: 1:80 {titer} — ABNORMAL HIGH

## 2019-04-25 LAB — PROTEIN ELECTROPHORESIS, SERUM
Albumin ELP: 4.5 g/dL (ref 3.8–4.8)
Alpha 1: 0.3 g/dL (ref 0.2–0.3)
Alpha 2: 0.8 g/dL (ref 0.5–0.9)
Beta 2: 0.4 g/dL (ref 0.2–0.5)
Beta Globulin: 0.4 g/dL (ref 0.4–0.6)
Gamma Globulin: 1.2 g/dL (ref 0.8–1.7)
Total Protein: 7.5 g/dL (ref 6.1–8.1)

## 2019-04-25 LAB — ANA: Anti Nuclear Antibody (ANA): POSITIVE — AB

## 2019-05-09 ENCOUNTER — Other Ambulatory Visit: Payer: BC Managed Care – PPO

## 2019-06-27 ENCOUNTER — Other Ambulatory Visit: Payer: Self-pay | Admitting: Internal Medicine

## 2019-06-27 DIAGNOSIS — F32A Depression, unspecified: Secondary | ICD-10-CM

## 2019-06-27 MED ORDER — BUPROPION HCL ER (XL) 150 MG PO TB24: 150.0000 mg | ORAL_TABLET | Freq: Every day | ORAL | 1 refills | Status: DC

## 2019-07-10 ENCOUNTER — Ambulatory Visit
Admission: RE | Admit: 2019-07-10 | Discharge: 2019-07-10 | Disposition: A | Discharge status: Home / Self Care | Payer: BC Managed Care – PPO | Source: Ambulatory Visit | Attending: Internal Medicine | Admitting: Internal Medicine

## 2019-07-10 ENCOUNTER — Encounter: Payer: Self-pay | Admitting: Internal Medicine

## 2019-07-10 DIAGNOSIS — M8580 Other specified disorders of bone density and structure, unspecified site: Secondary | ICD-10-CM | POA: Insufficient documentation

## 2019-07-10 DIAGNOSIS — E2839 Other primary ovarian failure: Secondary | ICD-10-CM | POA: Diagnosis not present

## 2019-07-10 DIAGNOSIS — Z1231 Encounter for screening mammogram for malignant neoplasm of breast: Secondary | ICD-10-CM | POA: Diagnosis present

## 2019-10-31 ENCOUNTER — Other Ambulatory Visit: Payer: Self-pay

## 2019-10-31 DIAGNOSIS — Z20822 Contact with and (suspected) exposure to covid-19: Secondary | ICD-10-CM

## 2019-11-01 LAB — NOVEL CORONAVIRUS, NAA: SARS-CoV-2, NAA: NOT DETECTED

## 2019-11-01 LAB — SARS-COV-2, NAA 2 DAY TAT

## 2019-12-04 ENCOUNTER — Ambulatory Visit: Payer: Self-pay | Attending: Internal Medicine

## 2019-12-04 DIAGNOSIS — Z23 Encounter for immunization: Secondary | ICD-10-CM

## 2019-12-04 NOTE — Progress Notes (Signed)
   Covid-19 Vaccination Clinic  Name:  Misty Rojas    MRN: [id number] DOB: 08-22-1955  12/04/2019  Misty Rojas was observed post Covid-19 immunization for 15 minutes without incident. She was provided with Vaccine Information Sheet and instruction to access the V-Safe system.   Misty Rojas was instructed to call 911 with any severe reactions post vaccine: Marland Kitchen Difficulty breathing  . Swelling of face and throat  . A fast heartbeat  . A bad rash all over body  . Dizziness and weakness

## 2020-01-16 ENCOUNTER — Telehealth: Payer: Self-pay | Admitting: Internal Medicine

## 2020-01-16 ENCOUNTER — Ambulatory Visit
Admission: RE | Admit: 2020-01-16 | Discharge: 2020-01-16 | Disposition: A | Discharge status: Home / Self Care | Payer: Medicare PPO | Source: Ambulatory Visit | Attending: Family Medicine | Admitting: Family Medicine

## 2020-01-16 VITALS — BP 128/82 | HR 75 | Temp 98.4°F | Resp 17 | Wt 157.0 lb

## 2020-01-16 DIAGNOSIS — J0110 Acute frontal sinusitis, unspecified: Secondary | ICD-10-CM

## 2020-01-16 MED ORDER — AMOXICILLIN-POT CLAVULANATE 875-125 MG PO TABS: 1.0000 | ORAL_TABLET | Freq: Two times a day (BID) | ORAL | 0 refills | Status: DC

## 2020-01-16 NOTE — ED Triage Notes (Signed)
Since Sunday pt has been having deadache, sinus pressure, and drainage that is thick and yellow.  Had a fever on Sunday. Took 2 tylenol  about 8am.

## 2020-01-16 NOTE — Discharge Instructions (Signed)
Treating you for a sinus infection.  Recommend using Flonase daily.  You can do 2 sprays in each nostril daily. Also recommend Zyrtec over-the-counter We will going to cover for possible bacterial sinusitis.  Take antibiotics as prescribed. Follow up as needed for continued or worsening symptoms

## 2020-01-16 NOTE — Telephone Encounter (Signed)
Patient went to be seen at urgent care, She states they gave her amoxicillin to take.  Informed her to call back in should her symptoms worsen or do not get better. Patient verbalized understanding

## 2020-01-16 NOTE — Telephone Encounter (Signed)
Pt called she is having sinus issues and congestion.Marland Kitchen offered pt a VV for Friday she declined and said she would just ride it out

## 2020-01-17 NOTE — ED Provider Notes (Signed)
[facility]    CSN: [id number] Arrival date & time: 01/16/20  1201      History   Chief Complaint Chief Complaint  Patient presents with  . URI    HPI Misty Rojas is a 64 y.o. female.   Patient is a 64 year old female presents today with headache, sinus pressure, thick yellow mucus from sinuses.  Had fever on Sunday.  Took 2 Tylenol at 8:00 this morning due to headache.  Does have a history of sinus infections.  No cough, chest congestion or shortness of breath.     Past Medical History:  Diagnosis Date  . Family history of breast cancer    mother and m. aunt   . Lateral epicondylitis   . Medial epicondylitis   . Medial epicondylitis    right   . Rosacea     Patient Active Problem List   Diagnosis Date Noted  . Osteopenia 07/10/2019  . Cough 03/14/2019  . Pansinusitis 01/19/2018  . Bilateral impacted cerumen 01/19/2018  . Family history of breast cancer 01/28/2017  . Rosacea 01/28/2017  . Medial epicondylitis 01/28/2017  . Lateral epicondylitis 01/28/2017  . Annual physical exam 01/28/2017    Past Surgical History:  Procedure Laterality Date  . WISDOM TOOTH EXTRACTION      OB History   No obstetric history on file.      Home Medications    Prior to Admission medications   Medication Sig Start Date End Date Taking? Authorizing Provider  amoxicillin-clavulanate (AUGMENTIN) 875-125 MG tablet Take 1 tablet by mouth every 12 (twelve) hours. 01/16/20   Dahlia Byes A, NP  Ascorbic Acid (VITAMIN C PO) Take by mouth.    [provider]  buPROPion (WELLBUTRIN XL) 150 MG 24 hr tablet Take 1 tablet (150 mg total) by mouth daily. appt further refills after 04/07/2019 06/27/19   McLean-Scocuzza, Pasty Spillers, MD  Cholecalciferol (VITAMIN D-3) 1000 units CAPS Take 2 capsules by mouth daily.     [provider]  ELDERBERRY PO Take by mouth.    [provider]  fluticasone (FLONASE) 50 MCG/ACT nasal spray Place 2 sprays into both  nostrils daily as needed. 04/19/19   McLean-Scocuzza, Pasty Spillers, MD  Omega-3 Fatty Acids (FISH OIL) 1200 MG CAPS Take 1 capsule by mouth daily.    [provider]    Family History Family History  Problem Relation Age of Onset  . Arthritis Mother   . Cancer Mother        breast   . Breast cancer Mother 67  . Cancer Father        prostate, kidney with mets  . Alcohol abuse Sister   . Cancer Sister   . Early death Sister   . Cancer Maternal Aunt        breast   . Breast cancer Maternal Aunt 40  . Arthritis Sister     Social History Social History   Tobacco Use  . Smoking status: Never Smoker  . Smokeless tobacco: Never Used  Vaping Use  . Vaping Use: Never used  Substance Use Topics  . Alcohol use: No  . Drug use: No     Allergies   Patient has no known allergies.   Review of Systems Review of Systems   Physical Exam Triage Vital Signs ED Triage Vitals  Enc Vitals Group     BP 01/16/20 1214 128/82     Pulse Rate 01/16/20 1214 75     Resp 01/16/20 1214 17  Temp 01/16/20 1214 98.4 F (36.9 C)     Temp Source 01/16/20 1214 Oral     SpO2 01/16/20 1214 99 %     Weight 01/16/20 1215 157 lb (71.2 kg)     Height --      Head Circumference --      Peak Flow --      Pain Score 01/16/20 1215 0     Pain Loc --      Pain Edu? --      Excl. in GC? --    No data found.  Updated Vital Signs BP 128/82 (BP Location: Right Arm)   Pulse 75   Temp 98.4 F (36.9 C) (Oral)   Resp 17   Wt 157 lb (71.2 kg)   SpO2 99%   BMI 24.59 kg/m   Visual Acuity Right Eye Distance:   Left Eye Distance:   Bilateral Distance:    Right Eye Near:   Left Eye Near:    Bilateral Near:     Physical Exam Vitals and nursing note reviewed.  Constitutional:      General: She is not in acute distress.    Appearance: Normal appearance. She is not ill-appearing, toxic-appearing or diaphoretic.  HENT:     Head: Normocephalic.     Right Ear: Tympanic membrane and ear  canal normal.     Left Ear: Tympanic membrane and ear canal normal.     Nose: Congestion present.     Mouth/Throat:     Pharynx: Oropharynx is clear.  Eyes:     Conjunctiva/sclera: Conjunctivae normal.  Cardiovascular:     Rate and Rhythm: Normal rate and regular rhythm.  Pulmonary:     Effort: Pulmonary effort is normal.     Breath sounds: Normal breath sounds.  Musculoskeletal:        General: Normal range of motion.     Cervical back: Normal range of motion.  Skin:    General: Skin is warm and dry.     Findings: No rash.  Neurological:     Mental Status: She is alert.  Psychiatric:        Mood and Affect: Mood normal.      UC Treatments / Results  Labs (all labs ordered are listed, but only abnormal results are displayed) Labs Reviewed - No data to display  EKG   Radiology No results found.  Procedures Procedures (including critical care time)  Medications Ordered in UC Medications - No data to display  Initial Impression / Assessment and Plan / UC Course  I have reviewed the triage vital signs and the nursing notes.  Pertinent labs & imaging results that were available during my care of the patient were reviewed by me and considered in my medical decision making (see chart for details).     Sinusitis Treating for bacterial sinusitis at this time. Antibiotics as prescribed.  Recommended Flonase and Zyrtec daily. Follow up as needed for continued or worsening symptoms  Final Clinical Impressions(s) / UC Diagnoses   Final diagnoses:  Acute non-recurrent frontal sinusitis     Discharge Instructions     Treating you for a sinus infection.  Recommend using Flonase daily.  You can do 2 sprays in each nostril daily. Also recommend Zyrtec over-the-counter We will going to cover for possible bacterial sinusitis.  Take antibiotics as prescribed. Follow up as needed for continued or worsening symptoms     ED Prescriptions    Medication Sig Dispense  Auth. Provider  amoxicillin-clavulanate (AUGMENTIN) 875-125 MG tablet Take 1 tablet by mouth every 12 (twelve) hours. 14 tablet Liat Mayol A, NP     PDMP not reviewed this encounter.   Janace Aris, NP 01/17/20 [phone]

## 2020-01-29 ENCOUNTER — Other Ambulatory Visit (HOSPITAL_COMMUNITY)
Admission: RE | Admit: 2020-01-29 | Discharge: 2020-01-29 | Disposition: A | Discharge status: Home / Self Care | Payer: Medicare PPO | Source: Ambulatory Visit | Attending: Internal Medicine | Admitting: Internal Medicine

## 2020-01-29 ENCOUNTER — Other Ambulatory Visit: Payer: Self-pay

## 2020-01-29 ENCOUNTER — Encounter: Payer: Self-pay | Admitting: Internal Medicine

## 2020-01-29 ENCOUNTER — Ambulatory Visit (INDEPENDENT_AMBULATORY_CARE_PROVIDER_SITE_OTHER): Payer: Medicare PPO | Admitting: Internal Medicine

## 2020-01-29 VITALS — BP 130/80 | HR 81 | Temp 98.2°F | Ht 67.0 in | Wt 160.2 lb

## 2020-01-29 DIAGNOSIS — Z1329 Encounter for screening for other suspected endocrine disorder: Secondary | ICD-10-CM

## 2020-01-29 DIAGNOSIS — Z1151 Encounter for screening for human papillomavirus (HPV): Secondary | ICD-10-CM | POA: Insufficient documentation

## 2020-01-29 DIAGNOSIS — E559 Vitamin D deficiency, unspecified: Secondary | ICD-10-CM

## 2020-01-29 DIAGNOSIS — Z0000 Encounter for general adult medical examination without abnormal findings: Secondary | ICD-10-CM

## 2020-01-29 DIAGNOSIS — F32A Depression, unspecified: Secondary | ICD-10-CM

## 2020-01-29 DIAGNOSIS — Z124 Encounter for screening for malignant neoplasm of cervix: Secondary | ICD-10-CM | POA: Diagnosis present

## 2020-01-29 DIAGNOSIS — R748 Abnormal levels of other serum enzymes: Secondary | ICD-10-CM

## 2020-01-29 DIAGNOSIS — Z1389 Encounter for screening for other disorder: Secondary | ICD-10-CM

## 2020-01-29 DIAGNOSIS — Z1231 Encounter for screening mammogram for malignant neoplasm of breast: Secondary | ICD-10-CM

## 2020-01-29 DIAGNOSIS — J324 Chronic pansinusitis: Secondary | ICD-10-CM

## 2020-01-29 MED ORDER — FLUTICASONE PROPIONATE 50 MCG/ACT NA SUSP: 2.0000 | Freq: Every day | NASAL | 11 refills | Status: DC | PRN

## 2020-01-29 MED ORDER — BUPROPION HCL ER (XL) 150 MG PO TB24: 150.0000 mg | ORAL_TABLET | Freq: Every day | ORAL | 3 refills | Status: DC

## 2020-01-29 NOTE — Progress Notes (Signed)
Chief Complaint  Patient presents with  . Follow-up  . Gynecologic Exam   F/u with pap today no issues  Mood controlled on wellbutrin 150 mg qd xl   Review of Systems  Constitutional: Negative for weight loss.  HENT: Negative for hearing loss.   Eyes: Negative for blurred vision.  Respiratory: Negative for shortness of breath.   Cardiovascular: Negative for chest pain.  Gastrointestinal: Negative for abdominal pain.  Musculoskeletal: Negative for falls.  Skin: Negative for rash.  Neurological: Negative for headaches.  Psychiatric/Behavioral: Negative for depression.   Past Medical History:  Diagnosis Date  . Family history of breast cancer    mother and m. aunt   . Lateral epicondylitis   . Medial epicondylitis   . Medial epicondylitis    right   . Rosacea    Past Surgical History:  Procedure Laterality Date  . WISDOM TOOTH EXTRACTION     Family History  Problem Relation Age of Onset  . Arthritis Mother   . Cancer Mother        breast   . Breast cancer Mother 24  . Cancer Father        prostate, kidney with mets  . Alcohol abuse Sister   . Cancer Sister   . Early death Sister   . Cancer Maternal Aunt        breast   . Breast cancer Maternal Aunt 56  . Arthritis Sister    Social History   Socioeconomic History  . Marital status: Married    Spouse name: Not on file  . Number of children: Not on file  . Years of education: Not on file  . Highest education level: Not on file  Occupational History  . Not on file  Tobacco Use  . Smoking status: Never Smoker  . Smokeless tobacco: Never Used  Vaping Use  . Vaping Use: Never used  Substance and Sexual Activity  . Alcohol use: No  . Drug use: No  . Sexual activity: Not on file  Other Topics Concern  . 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 Cholecalciferol (VITAMIN D-3) 1000 units CAPS Take 4 capsules by mouth daily.  . fenoprofen (NALFON) 600 MG TABS tablet Take 600 mg by mouth in the morning and at bedtime.  . Omega-3 Fatty Acids (FISH OIL) 1200 MG CAPS Take 1 capsule by mouth daily.  . [DISCONTINUED] buPROPion (WELLBUTRIN XL) 150 MG 24 hr tablet Take 1 tablet (150 mg total) by mouth daily. appt further refills after 04/07/2019  . [DISCONTINUED] fluticasone (FLONASE) 50 MCG/ACT nasal spray Place 2 sprays into both nostrils daily as needed.   No Known Allergies No results found for this or any previous visit (from the past 2160 hour(s)). Objective  Body mass index is 25.09 kg/m. Wt Readings from Last 3 Encounters:  01/29/20 160 lb 3.2 oz (72.7 kg)  01/16/20 157 lb (71.2 kg)  04/19/19 154 lb 6.4 oz (70 kg)   Temp Readings from Last 3 Encounters:  01/29/20 98.2 F (36.8 C) (Oral)  01/16/20 98.4 F (  36.9 C) (Oral)  04/19/19 (!) 97 F (36.1 C) (Temporal)   BP Readings from Last 3 Encounters:  01/29/20 130/80  01/16/20 128/82  04/19/19 122/76   Pulse Readings from Last 3 Encounters:  01/29/20 81  01/16/20 75  04/19/19 72    Physical Exam Vitals and nursing note reviewed. Exam conducted with a chaperone present.  Constitutional:      Appearance: Normal appearance. She is well-developed, well-groomed and overweight.  HENT:     Head: Normocephalic and atraumatic.  Eyes:     Conjunctiva/sclera: Conjunctivae normal.     Pupils: Pupils are equal, round, and reactive to light.  Cardiovascular:     Rate and Rhythm: Normal rate and regular rhythm.     Heart sounds: No murmur heard.   Pulmonary:     Effort: Pulmonary effort is normal.     Breath sounds: Normal breath sounds.  Abdominal:     General: Abdomen is  flat. Bowel sounds are normal.     Tenderness: There is no abdominal tenderness.  Genitourinary:    Pubic Area: No rash.      Labia:        Right: No rash.        Left: No rash.      Vagina: Normal.     Cervix: Normal.     Uterus: Normal.      Adnexa: Right adnexa normal and left adnexa normal.  Skin:    General: Skin is warm and dry.  Neurological:     General: No focal deficit present.     Mental Status: She is alert and oriented to person, place, and time. Mental status is at baseline.     Gait: Gait normal.  Psychiatric:        Attention and Perception: Attention and perception normal.        Mood and Affect: Mood and affect normal.        Speech: Speech normal.        Behavior: Behavior normal. Behavior is cooperative.        Thought Content: Thought content normal.        Cognition and Memory: Cognition and memory normal.        Judgment: Judgment normal.     Assessment  Plan   Pansinusitis, unspecified chronicity - Plan: fluticasone (FLONASE) 50 MCG/ACT nasal spray Ns  otc antihistamine  No need ent today   Depression, unspecified depression type - Plan: buPROPion (WELLBUTRIN XL) 150 MG 24 hr tablet qd   HM-CPE in 1 year utd flu shot Consider prevnar today and pna 23 in 1 year  ?pna 23 3 years ago as of 01/2020 due 2nd part 2  -call back and let me know which pna 23 vaccine she had  Tdap had 01/16/16 shingrix had 2/2 doses rec MMR vaccine covid vx moderna 3/3 utd Hep B immune Hep C neg Reviewed labs 04/17/19   Pap 12 21/18 due 01/2020 neg neg HPV cervical polyp removed OB/GYN -pap today final pap  cologuard 04/24/2018 negative repeat in 3 years  Never smoker  07/10/19 osteopenia DEXA consider repeat in 3-5 years mammogram 07/10/19 neg Consider healthy diet choices and exercise  D3 4000 IU daily Derm no issues for now 01/29/20 Consider EKG at f/u  Consider renal in the future   Stage 3a chronic kidney disease with elevated Creatinine mildly >1 ?  Etiology was doing nsaids in 03/2019 - Plan: Microalbumin / creatinine urine ratio, Antinuclear Antib (ANA), Protein Electrophoresis, (serum),  Protein Electrophoresis, Urine Rflx., Urinalysis, Routine w reflex microscopic, Urine Culture, Basic Metabolic Panel (BMET), Sodium, urine, random Consider renal US in future pt wants to hold for now as of 04/19/19   ent no need for now for sinusitis as of 01/29/20  Provider: Dr. Olivia Mackie McLean-Scocuzza-Internal Medicine

## 2020-01-29 NOTE — Patient Instructions (Addendum)
cologuard due 04/23/2021  Repeat dexa in 3-5 years bone density  Vitamin D3 2000 IU to 4000 IU daily  Calcium 600 to 1200 mg daily   Check on prevnar vs pneumonia 23 vaccine and if you want prevnar here call back and schedule nurse visit   Pneumococcal Conjugate Vaccine suspension for injection What is this medicine? PNEUMOCOCCAL VACCINE (NEU mo KOK al vak SEEN) is a vaccine used to prevent pneumococcus bacterial infections. These bacteria can cause serious infections like pneumonia, meningitis, and blood infections. This vaccine will lower your chance of getting pneumonia. If you do get pneumonia, it can make your symptoms milder and your illness shorter. This vaccine will not treat an infection and will not cause infection. This vaccine is recommended for infants and young children, adults with certain medical conditions, and adults 65 years or older. This medicine may be used for other purposes; ask your health care provider or pharmacist if you have questions. COMMON BRAND NAME(S): Prevnar, Prevnar 13 What should I tell my health care provider before I take this medicine? They need to know if you have any of these conditions:  bleeding problems  fever  immune system problems  an unusual or allergic reaction to pneumococcal vaccine, diphtheria toxoid, other vaccines, latex, other medicines, foods, dyes, or preservatives  pregnant or trying to get pregnant  breast-feeding How should I use this medicine? This vaccine is for injection into a muscle. It is given by a health care professional. A copy of Vaccine Information Statements will be given before each vaccination. Read this sheet carefully each time. The sheet may change frequently. Talk to your pediatrician regarding the use of this medicine in children. While this drug may be prescribed for children as young as 75 weeks old for selected conditions, precautions do apply. Overdosage: If you think you have taken too much of this  medicine contact a poison control center or emergency room at once. NOTE: This medicine is only for you. Do not share this medicine with others. What if I miss a dose? It is important not to miss your dose. Call your doctor or health care professional if you are unable to keep an appointment. What may interact with this medicine?  medicines for cancer chemotherapy  medicines that suppress your immune function  steroid medicines like prednisone or cortisone This list may not describe all possible interactions. Give your health care provider a list of all the medicines, herbs, non-prescription drugs, or dietary supplements you use. Also tell them if you smoke, drink alcohol, or use illegal drugs. Some items may interact with your medicine. What should I watch for while using this medicine? Mild fever and pain should go away in 3 days or less. Report any unusual symptoms to your doctor or health care professional. What side effects may I notice from receiving this medicine? Side effects that you should report to your doctor or health care professional as soon as possible:  allergic reactions like skin rash, itching or hives, swelling of the face, lips, or tongue  breathing problems  confused  fast or irregular heartbeat  fever over 102 degrees F  seizures  unusual bleeding or bruising  unusual muscle weakness Side effects that usually do not require medical attention (report to your doctor or health care professional if they continue or are bothersome):  aches and pains  diarrhea  fever of 102 degrees F or less  headache  irritable  loss of appetite  pain, tender at site where injected  trouble sleeping This list may not describe all possible side effects. Call your doctor for medical advice about side effects. You may report side effects to FDA at 1-800-FDA-1088. Where should I keep my medicine? This does not apply. This vaccine is given in a clinic, pharmacy,  doctor's office, or other health care setting and will not be stored at home. NOTE: This sheet is a summary. It may not cover all possible information. If you have questions about this medicine, talk to your doctor, pharmacist, or health care provider.  2020 Elsevier/Gold Standard (2013-11-01 10:27:27)

## 2020-01-30 LAB — CYTOLOGY - PAP
Comment: NEGATIVE
Diagnosis: NEGATIVE
High risk HPV: NEGATIVE

## 2020-02-11 ENCOUNTER — Other Ambulatory Visit: Payer: Medicare PPO

## 2020-02-13 ENCOUNTER — Other Ambulatory Visit: Payer: Medicare PPO

## 2020-02-13 DIAGNOSIS — Z20822 Contact with and (suspected) exposure to covid-19: Secondary | ICD-10-CM

## 2020-02-16 LAB — NOVEL CORONAVIRUS, NAA: SARS-CoV-2, NAA: NOT DETECTED

## 2020-04-22 ENCOUNTER — Other Ambulatory Visit (INDEPENDENT_AMBULATORY_CARE_PROVIDER_SITE_OTHER): Payer: Medicare PPO

## 2020-04-22 ENCOUNTER — Encounter: Payer: BC Managed Care – PPO | Admitting: Internal Medicine

## 2020-04-22 ENCOUNTER — Other Ambulatory Visit: Payer: Self-pay

## 2020-04-22 DIAGNOSIS — Z0000 Encounter for general adult medical examination without abnormal findings: Secondary | ICD-10-CM | POA: Diagnosis not present

## 2020-04-22 DIAGNOSIS — Z1389 Encounter for screening for other disorder: Secondary | ICD-10-CM | POA: Diagnosis not present

## 2020-04-22 DIAGNOSIS — E559 Vitamin D deficiency, unspecified: Secondary | ICD-10-CM

## 2020-04-22 DIAGNOSIS — Z1329 Encounter for screening for other suspected endocrine disorder: Secondary | ICD-10-CM

## 2020-04-22 LAB — COMPREHENSIVE METABOLIC PANEL
ALT: 15 U/L (ref 0–35)
AST: 21 U/L (ref 0–37)
Albumin: 4.1 g/dL (ref 3.5–5.2)
Alkaline Phosphatase: 120 U/L — ABNORMAL HIGH (ref 39–117)
BUN: 21 mg/dL (ref 6–23)
CO2: 29 mEq/L (ref 19–32)
Calcium: 9.3 mg/dL (ref 8.4–10.5)
Chloride: 101 mEq/L (ref 96–112)
Creatinine, Ser: 1.06 mg/dL (ref 0.40–1.20)
GFR: 55.06 mL/min — ABNORMAL LOW (ref >60.00)
Glucose, Bld: 82 mg/dL (ref 70–99)
Potassium: 4.5 mEq/L (ref 3.5–5.1)
Sodium: 137 mEq/L (ref 135–145)
Total Bilirubin: 0.9 mg/dL (ref 0.2–1.2)
Total Protein: 7 g/dL (ref 6.0–8.3)

## 2020-04-22 LAB — LIPID PANEL
Cholesterol: 194 mg/dL (ref 0–200)
HDL: 54.6 mg/dL (ref >39.00)
LDL Cholesterol: 120 mg/dL — ABNORMAL HIGH (ref 0–99)
NonHDL: 139.82
Total CHOL/HDL Ratio: 4
Triglycerides: 101 mg/dL (ref 0.0–149.0)
VLDL: 20.2 mg/dL (ref 0.0–40.0)

## 2020-04-22 LAB — CBC WITH DIFFERENTIAL/PLATELET
Basophils Absolute: 0 10*3/uL (ref 0.0–0.1)
Basophils Relative: 1.1 % (ref 0.0–3.0)
Eosinophils Absolute: 0.4 10*3/uL (ref 0.0–0.7)
Eosinophils Relative: 9.2 % — ABNORMAL HIGH (ref 0.0–5.0)
HCT: 40.6 % (ref 36.0–46.0)
Hemoglobin: 13.9 g/dL (ref 12.0–15.0)
Lymphocytes Relative: 38.7 % (ref 12.0–46.0)
Lymphs Abs: 1.8 10*3/uL (ref 0.7–4.0)
MCHC: 34.2 g/dL (ref 30.0–36.0)
MCV: 93.6 fl (ref 78.0–100.0)
Monocytes Absolute: 0.3 10*3/uL (ref 0.1–1.0)
Monocytes Relative: 7.5 % (ref 3.0–12.0)
Neutro Abs: 2 10*3/uL (ref 1.4–7.7)
Neutrophils Relative %: 43.5 % (ref 43.0–77.0)
Platelets: 257 10*3/uL (ref 150.0–400.0)
RBC: 4.34 Mil/uL (ref 3.87–5.11)
RDW: 13 % (ref 11.5–15.5)
WBC: 4.5 10*3/uL (ref 4.0–10.5)

## 2020-04-22 LAB — TSH: TSH: 3.01 u[IU]/mL (ref 0.35–4.50)

## 2020-04-22 LAB — VITAMIN D 25 HYDROXY (VIT D DEFICIENCY, FRACTURES): VITD: 77.42 ng/mL (ref 30.00–100.00)

## 2020-04-23 ENCOUNTER — Encounter: Payer: Self-pay | Admitting: Internal Medicine

## 2020-04-23 LAB — URINALYSIS, ROUTINE W REFLEX MICROSCOPIC
Bacteria, UA: NONE SEEN /HPF
Bilirubin Urine: NEGATIVE
Glucose, UA: NEGATIVE
Hgb urine dipstick: NEGATIVE
Hyaline Cast: NONE SEEN /LPF
Ketones, ur: NEGATIVE
Nitrite: NEGATIVE
Protein, ur: NEGATIVE
RBC / HPF: NONE SEEN /HPF (ref 0–2)
Specific Gravity, Urine: 1.012 (ref 1.001–1.03)
pH: 6 (ref 5.0–8.0)

## 2020-04-23 NOTE — Addendum Note (Signed)
Addended by: Quentin Ore on: 04/23/2020 05:02 PM   Modules accepted: Orders

## 2020-04-23 NOTE — Telephone Encounter (Signed)
-----   Message from Bevelyn Buckles, MD sent at 04/23/2020  9:42 AM EDT ----- Cholesterol ldl slightly elevated from 116 to 120  -rec healthy diet and exercise  Continue to monitor  Alkaline phos elevated  -Agreeable to US abdomen?  -also further labs to work up break down of alkaline phos and ggt?  -any aches and pains in bones?    Kidney #s stable overall kidney function GFR 55 which goal is 60 and above please stay hydrated water 55-64 ounces daily  Vitamin D normal Thyroid lab normal  Blood cts normal eosinophils elevated could be due to allergies   Urine white blood cells  -any uti symptoms if so order and schedule culture

## 2020-04-23 NOTE — Telephone Encounter (Signed)
Cholesterol ldl slightly elevated from 116 to 120  -rec healthy diet and exercise  Continue to monitor  Alkaline phos elevated  -Agreeable to US abdomen?  -also further labs to work up break down of alkaline phos and ggt?  -any aches and pains in bones?    Kidney #s stable overall kidney function GFR 55 which goal is 60 and above please stay hydrated water 55-64 ounces daily  Vitamin D normal Thyroid lab normal  Blood cts normal eosinophils elevated could be due to allergies   Urine pending Written by Bevelyn Buckles, MD on 04/22/2020 2:29 PM EDT

## 2020-04-28 ENCOUNTER — Other Ambulatory Visit: Payer: Medicare PPO

## 2020-04-28 ENCOUNTER — Other Ambulatory Visit (INDEPENDENT_AMBULATORY_CARE_PROVIDER_SITE_OTHER): Payer: Medicare PPO

## 2020-04-28 ENCOUNTER — Other Ambulatory Visit: Payer: Self-pay

## 2020-04-28 DIAGNOSIS — R748 Abnormal levels of other serum enzymes: Secondary | ICD-10-CM | POA: Diagnosis not present

## 2020-04-30 LAB — ALKALINE PHOSPHATASE, ISOENZYMES
Alkaline Phosphatase: 155 IU/L — ABNORMAL HIGH (ref 44–121)
BONE FRACTION: 56 % (ref 14–68)
INTESTINAL FRAC.: 17 % (ref 0–18)
LIVER FRACTION: 27 % (ref 18–85)

## 2020-04-30 LAB — SPECIMEN STATUS REPORT

## 2020-05-01 LAB — GAMMA GT: GGT: 12 IU/L (ref 0–60)

## 2020-05-02 ENCOUNTER — Encounter: Payer: Self-pay | Admitting: Internal Medicine

## 2020-05-05 NOTE — Telephone Encounter (Signed)
-----   Message from Bevelyn Buckles, MD sent at 05/02/2020  4:12 PM EDT ----- Alkaline phos elevated and increasing  GGT normal  Is patient agreeable to bone scan to further work this up?

## 2020-05-07 DIAGNOSIS — H43813 Vitreous degeneration, bilateral: Secondary | ICD-10-CM | POA: Diagnosis not present

## 2020-05-07 DIAGNOSIS — H2513 Age-related nuclear cataract, bilateral: Secondary | ICD-10-CM | POA: Diagnosis not present

## 2020-05-07 DIAGNOSIS — H11151 Pinguecula, right eye: Secondary | ICD-10-CM | POA: Diagnosis not present

## 2020-05-12 ENCOUNTER — Other Ambulatory Visit: Payer: Self-pay

## 2020-05-12 ENCOUNTER — Ambulatory Visit
Admission: RE | Admit: 2020-05-12 | Discharge: 2020-05-12 | Disposition: A | Discharge status: Home / Self Care | Payer: Medicare PPO | Source: Ambulatory Visit | Attending: Internal Medicine | Admitting: Internal Medicine

## 2020-05-12 DIAGNOSIS — R748 Abnormal levels of other serum enzymes: Secondary | ICD-10-CM | POA: Insufficient documentation

## 2020-05-20 ENCOUNTER — Ambulatory Visit: Payer: Medicare PPO | Attending: Internal Medicine

## 2020-05-20 DIAGNOSIS — Z23 Encounter for immunization: Secondary | ICD-10-CM

## 2020-05-20 NOTE — Progress Notes (Signed)
   Covid-19 Vaccination Clinic  Name:  Misty Rojas    MRN: [id number] DOB: August 15, 1955  05/20/2020  Misty Rojas was observed post Covid-19 immunization for 15 minutes without incident. She was provided with Vaccine Information Sheet and instruction to access the V-Safe system.   Misty Rojas was instructed to call 911 with any severe reactions post vaccine: Marland Kitchen Difficulty breathing  . Swelling of face and throat  . A fast heartbeat  . A bad rash all over body  . Dizziness and weakness   Immunizations Administered    Name Date Dose VIS Date Route   Moderna Covid-19 Booster Vaccine 05/20/2020  1:50 PM 0.25 mL 11/28/2019 Intramuscular   Manufacturer: Gala Murdoch   Lot: [id number]   NDC: 98264-158-30

## 2020-05-21 ENCOUNTER — Other Ambulatory Visit: Payer: Self-pay

## 2020-05-21 MED ORDER — MODERNA COVID-19 VACCINE 100 MCG/0.5ML IM SUSP
INTRAMUSCULAR | 0 refills | Status: DC
Start: 2020-05-20 — End: 2020-07-09
  Filled 2020-05-21: qty 0.25, 20d supply, fill #0

## 2020-05-22 ENCOUNTER — Other Ambulatory Visit: Payer: Self-pay

## 2020-05-26 NOTE — Addendum Note (Signed)
Addended by: Quentin Ore on: 05/26/2020 09:27 PM   Modules accepted: Orders

## 2020-05-29 ENCOUNTER — Other Ambulatory Visit: Payer: Self-pay

## 2020-06-05 ENCOUNTER — Encounter
Admission: RE | Admit: 2020-06-05 | Discharge: 2020-06-05 | Disposition: A | Discharge status: Home / Self Care | Payer: Medicare PPO | Source: Ambulatory Visit | Attending: Internal Medicine | Admitting: Internal Medicine

## 2020-06-05 ENCOUNTER — Other Ambulatory Visit: Payer: Self-pay

## 2020-06-05 DIAGNOSIS — M19071 Primary osteoarthritis, right ankle and foot: Secondary | ICD-10-CM | POA: Diagnosis not present

## 2020-06-05 DIAGNOSIS — M19072 Primary osteoarthritis, left ankle and foot: Secondary | ICD-10-CM | POA: Diagnosis not present

## 2020-06-05 DIAGNOSIS — R748 Abnormal levels of other serum enzymes: Secondary | ICD-10-CM | POA: Diagnosis not present

## 2020-06-05 DIAGNOSIS — M19041 Primary osteoarthritis, right hand: Secondary | ICD-10-CM | POA: Diagnosis not present

## 2020-06-05 MED ORDER — TECHNETIUM TC 99M MEDRONATE IV KIT
20.0000 | PACK | Freq: Once | INTRAVENOUS | Status: AC | PRN
  Administered 2020-06-05: 23.6 via INTRAVENOUS

## 2020-06-06 ENCOUNTER — Encounter: Payer: Self-pay | Admitting: Internal Medicine

## 2020-06-09 NOTE — Telephone Encounter (Signed)
-----   Message from Bevelyn Buckles, MD sent at 06/06/2020  3:44 PM EDT ----- Uptake in sinus thought to be due to chronic sinus issues Any sinus pain/pressure?  Arthritis changes in feet and thumbs likely osteoarthritis  Does she have chronic leg swelling concern with possibly vein valves in legs not working -does she want to see ENT or vascular for any of above mentioned on bone scan?

## 2020-07-07 ENCOUNTER — Encounter: Payer: Self-pay | Admitting: Internal Medicine

## 2020-07-08 ENCOUNTER — Other Ambulatory Visit: Payer: Self-pay

## 2020-07-08 ENCOUNTER — Ambulatory Visit (LOCAL_COMMUNITY_HEALTH_CENTER): Payer: Medicare PPO

## 2020-07-08 ENCOUNTER — Other Ambulatory Visit: Payer: Self-pay | Admitting: Internal Medicine

## 2020-07-08 DIAGNOSIS — Z23 Encounter for immunization: Secondary | ICD-10-CM | POA: Diagnosis not present

## 2020-07-08 NOTE — Progress Notes (Signed)
Patient presents to nurse clinic for MMR vaccine . Pt had low immunity titer results from lab testing on 2019 and provider instructed pt to get MMR vaccine. Vaccine administered and tolerated well. [facility] updates and copy given to pt.  

## 2020-07-08 NOTE — Telephone Encounter (Signed)
Please advise, medication states historical provider. Are you familiar with this medication?

## 2020-07-09 ENCOUNTER — Encounter: Payer: Self-pay | Admitting: Internal Medicine

## 2020-07-09 ENCOUNTER — Ambulatory Visit: Payer: Medicare PPO | Admitting: Internal Medicine

## 2020-07-09 VITALS — BP 130/70 | HR 82 | Temp 97.7°F | Ht 67.0 in | Wt 165.8 lb

## 2020-07-09 DIAGNOSIS — L570 Actinic keratosis: Secondary | ICD-10-CM | POA: Diagnosis not present

## 2020-07-09 DIAGNOSIS — L989 Disorder of the skin and subcutaneous tissue, unspecified: Secondary | ICD-10-CM

## 2020-07-09 DIAGNOSIS — Z1283 Encounter for screening for malignant neoplasm of skin: Secondary | ICD-10-CM | POA: Diagnosis not present

## 2020-07-09 DIAGNOSIS — Z0000 Encounter for general adult medical examination without abnormal findings: Secondary | ICD-10-CM

## 2020-07-09 NOTE — Progress Notes (Signed)
Chief Complaint  Patient presents with  . Skin Problem    Red spot on the left arm   Left upper arm lesion no bug bite scratching at times b/c its there but not itching will stratch at times no bleeding 0.5 cm and right temple lesion been there x years but more red rough and growing nothing tried  Will refer to dermatoloy     Review of Systems  Eyes: Negative for blurred vision.  Respiratory: Negative for shortness of breath.   Cardiovascular: Negative for chest pain.  Gastrointestinal: Negative for abdominal pain.  Musculoskeletal: Positive for joint pain. Negative for falls.  Skin: Positive for rash. Negative for itching.  Neurological: Negative for headaches.  Psychiatric/Behavioral: Negative for depression.   Past Medical History:  Diagnosis Date  . Family history of breast cancer    mother and m. aunt   . Lateral epicondylitis   . Medial epicondylitis   . Medial epicondylitis    right   . Rosacea    Past Surgical History:  Procedure Laterality Date  . WISDOM TOOTH EXTRACTION     Family History  Problem Relation Age of Onset  . Arthritis Mother   . Cancer Mother        breast   . Breast cancer Mother 1  . Cancer Father        prostate, kidney with mets  . Alcohol abuse Sister   . Cancer Sister   . Early death Sister   . Cancer Maternal Aunt        breast   . Breast cancer Maternal Aunt 23  . Arthritis Sister    Social History   Socioeconomic History  . Marital status: Married    Spouse name: Not on file  . Number of children: Not on file  . Years of education: Not on file  . Highest education level: Not on file  Occupational History  . Not on file  Tobacco Use  . Smoking status: Never Smoker  . Smokeless tobacco: Never Used  Vaping Use  . Vaping Use: Never used  Substance and Sexual Activity  . Alcohol use: No  . Drug use: No  . Sexual activity: Not on file  Other Topics Concern  . 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 buPROPion (WELLBUTRIN XL) 150 MG 24 hr tablet Take 1 tablet (150 mg total) by mouth daily.  . Cholecalciferol (VITAMIN D-3) 1000 units CAPS Take 4 capsules by mouth daily.  . fluticasone (FLONASE) 50 MCG/ACT nasal spray Place 2 sprays into both nostrils daily as needed.  . Omega-3 Fatty Acids (FISH OIL) 1200 MG CAPS Take 1 capsule by mouth daily.   No Known Allergies Recent Results (from the past 2160 hour(s))  Vitamin D (25 hydroxy)     Status: None   Collection Time: 04/22/20  8:29 AM  Result Value Ref Range   VITD 77.42 30.00 - 100.00 ng/mL  Urinalysis, Routine w reflex microscopic     Status: Abnormal   Collection Time: 04/22/20  8:29 AM  Result Value Ref  Range   Color, Urine YELLOW YELLOW   APPearance CLEAR CLEAR   Specific Gravity, Urine 1.012 1.001 - 1.03   pH 6.0 5.0 - 8.0   Glucose, UA NEGATIVE NEGATIVE   Bilirubin Urine NEGATIVE NEGATIVE   Ketones, ur NEGATIVE NEGATIVE   Hgb urine dipstick NEGATIVE NEGATIVE   Protein, ur NEGATIVE NEGATIVE   Nitrite NEGATIVE NEGATIVE   Leukocytes,Ua 3+ (A) NEGATIVE   WBC, UA 20-40 (A) 0 - 5 /HPF   RBC / HPF NONE SEEN 0 - 2 /HPF   Squamous Epithelial / LPF 0-5 < OR = 5 /HPF   Bacteria, UA NONE SEEN NONE SEEN /HPF   Hyaline Cast NONE SEEN NONE SEEN /LPF  TSH     Status: None   Collection Time: 04/22/20  8:29 AM  Result Value Ref Range   TSH 3.01 0.35 - 4.50 uIU/mL  CBC with Differential/Platelet     Status: Abnormal   Collection Time: 04/22/20  8:29 AM  Result Value Ref Range   WBC 4.5 4.0 - 10.5 K/uL   RBC 4.34 3.87 - 5.11 Mil/uL   Hemoglobin 13.9 12.0 - 15.0 g/dL   HCT 40.6 36.0 - 46.0 %    MCV 93.6 78.0 - 100.0 fl   MCHC 34.2 30.0 - 36.0 g/dL   RDW 13.0 11.5 - 15.5 %   Platelets 257.0 150.0 - 400.0 K/uL   Neutrophils Relative % 43.5 43.0 - 77.0 %   Lymphocytes Relative 38.7 12.0 - 46.0 %   Monocytes Relative 7.5 3.0 - 12.0 %   Eosinophils Relative 9.2 (H) 0.0 - 5.0 %   Basophils Relative 1.1 0.0 - 3.0 %   Neutro Abs 2.0 1.4 - 7.7 K/uL   Lymphs Abs 1.8 0.7 - 4.0 K/uL   Monocytes Absolute 0.3 0.1 - 1.0 K/uL   Eosinophils Absolute 0.4 0.0 - 0.7 K/uL   Basophils Absolute 0.0 0.0 - 0.1 K/uL  Lipid panel     Status: Abnormal   Collection Time: 04/22/20  8:29 AM  Result Value Ref Range   Cholesterol 194 0 - 200 mg/dL    Comment: ATP III Classification       Desirable:  < 200 mg/dL               Borderline High:  200 - 239 mg/dL          High:  > = 240 mg/dL   Triglycerides 101.0 0.0 - 149.0 mg/dL    Comment: Normal:  <150 mg/dLBorderline High:  150 - 199 mg/dL   HDL 54.60 >39.00 mg/dL   VLDL 20.2 0.0 - 40.0 mg/dL   LDL Cholesterol 120 (H) 0 - 99 mg/dL   Total CHOL/HDL Ratio 4     Comment:                Men          Women1/2 Average Risk     3.4          3.3Average Risk          5.0          4.42X Average Risk          9.6          7.13X Average Risk          15.0          11.0  NonHDL 139.82     Comment: NOTE:  Non-HDL goal should be 30 mg/dL higher than patient's LDL goal (i.e. LDL goal of < 70 mg/dL, would have non-HDL goal of < 100 mg/dL)  Comprehensive metabolic panel     Status: Abnormal   Collection Time: 04/22/20  8:29 AM  Result Value Ref Range   Sodium 137 135 - 145 mEq/L   Potassium 4.5 3.5 - 5.1 mEq/L   Chloride 101 96 - 112 mEq/L   CO2 29 19 - 32 mEq/L   Glucose, Bld 82 70 - 99 mg/dL   BUN 21 6 - 23 mg/dL   Creatinine, Ser 1.06 0.40 - 1.20 mg/dL   Total Bilirubin 0.9 0.2 - 1.2 mg/dL   Alkaline Phosphatase 120 (H) 39 - 117 U/L   AST 21 0 - 37 U/L   ALT 15 0 - 35 U/L   Total Protein 7.0 6.0 - 8.3 g/dL   Albumin 4.1 3.5 - 5.2 g/dL    GFR 55.06 (L) >60.00 mL/min    Comment: Calculated using the CKD-EPI Creatinine Equation (2021)   Calcium 9.3 8.4 - 10.5 mg/dL  Alkaline phosphatase, isoenzymes     Status: Abnormal   Collection Time: 04/28/20 12:00 AM  Result Value Ref Range   Alkaline Phosphatase 155 (H) 44 - 121 IU/L   LIVER FRACTION 27 18 - 85 %   BONE FRACTION 56 14 - 68 %   INTESTINAL FRAC. 17 0 - 18 %  Gamma GT     Status: None   Collection Time: 04/28/20 12:00 AM  Result Value Ref Range   GGT 12 0 - 60 IU/L  Specimen status report     Status: None   Collection Time: 04/28/20 12:00 AM  Result Value Ref Range   specimen status report Comment     Comment: Please note Please note The date and/or time of collection was not indicated on the requisition as required by state and federal law.  The date of receipt of the specimen was used as the collection date if not supplied.    Objective  Body mass index is 25.97 kg/m. Wt Readings from Last 3 Encounters:  07/09/20 165 lb 12.8 oz (75.2 kg)  01/29/20 160 lb 3.2 oz (72.7 kg)  01/16/20 157 lb (71.2 kg)   Temp Readings from Last 3 Encounters:  07/09/20 97.7 F (36.5 C) (Oral)  01/29/20 98.2 F (36.8 C) (Oral)  01/16/20 98.4 F (36.9 C) (Oral)   BP Readings from Last 3 Encounters:  07/09/20 130/70  01/29/20 130/80  01/16/20 128/82   Pulse Readings from Last 3 Encounters:  07/09/20 82  01/29/20 81  01/16/20 75    Physical Exam Vitals and nursing note reviewed.  Constitutional:      Appearance: Normal appearance. She is well-developed and well-groomed.  HENT:     Head: Normocephalic and atraumatic.  Eyes:     Conjunctiva/sclera: Conjunctivae normal.     Pupils: Pupils are equal, round, and reactive to light.  Cardiovascular:     Rate and Rhythm: Normal rate and regular rhythm.     Heart sounds: Normal heart sounds. No murmur heard.   Pulmonary:     Effort: Pulmonary effort is normal.     Breath sounds: Normal breath sounds.  Skin:     General: Skin is warm and dry.  Neurological:     General: No focal deficit present.     Mental Status: She is alert and oriented to person, place, and  time. Mental status is at baseline.     Gait: Gait normal.  Psychiatric:        Attention and Perception: Attention and perception normal.        Mood and Affect: Mood and affect normal.        Speech: Speech normal.        Behavior: Behavior normal. Behavior is cooperative.        Thought Content: Thought content normal.        Cognition and Memory: Cognition and memory normal.     Assessment  Plan  Skin lesion of left upper extremity - Plan: Ambulatory referral to Dermatology  Actinic keratosis vs ISK right temple- Plan: Ambulatory referral to Dermatology  Skin cancer screening - Plan: Ambulatory referral to Dermatology   annual utd flu shot Consider prevnar and pna 23 in 1 year in future  ?pna 23 3 years ago as of 01/2020 due 2nd part 2  -call back and let me know which pna 23 vaccine she had  Tdap had 01/16/16 shingrix had 2/2 doses rec MMR vaccine had MMR vaccine 07/08/20 3rd booster low measles health dept covid vx moderna 4/4utd Hep B immune Hep C neg Reviewed labs 04/22/20  Pap 12 21/18due 12/2021neg neg HPV cervical polyp removed OB/GYN -pap 01/29/20 neg neg HPV  cologuard 04/24/2018 negativerepeat in 3 years  Never smoker   07/10/19 osteopenia DEXAconsider repeat in 3-5 years mammogram 07/10/19 neg sch 07/10/20   Consider healthy diet choices and exercise  D3 4000 IU daily Derm referred 07/09/20   Consider EKG at f/u baseline Consider renal in the future   Provider: Dr. Olivia Mackie McLean-Scocuzza-[facility]

## 2020-07-09 NOTE — Patient Instructions (Addendum)
[address] [place] [address] [phone] Seborrheic Keratosis A seborrheic keratosis is a common, noncancerous (benign) skin growth. These growths are velvety, waxy, rough, tan, brown, or black spots that appear on the skin. These skin growths can be flat or raised, and scaly. What are the causes? The cause of this condition is not known. What increases the risk? You are more likely to develop this condition if you:  Have a family history of seborrheic keratosis.  Are 50 or older.  Are pregnant.  Have had estrogen replacement therapy. What are the signs or symptoms? Symptoms of this condition include growths on the face, chest, shoulders, back, or other areas. These growths:  Are usually painless, but may become irritated and itchy.  Can be yellow, brown, black, or other colors.  Are slightly raised or have a flat surface.  Are sometimes rough or wart-like in texture.  Are often velvety or waxy on the surface.  Are round or oval-shaped.  Often occur in groups, but may occur as a single growth.   How is this diagnosed? This condition is diagnosed with a medical history and physical exam.  A sample of the growth may be tested (skin biopsy).  You may need to see a skin specialist (dermatologist). How is this treated? Treatment is not usually needed for this condition, unless the growths are irritated or bleed often.  You may also choose to have the growths removed if you do not like their appearance. ? Most commonly, these growths are treated with a procedure in which liquid nitrogen is applied to "freeze" off the growth (cryosurgery). ? They may also be burned off with electricity (electrocautery) or removed by scraping (curettage). Follow these instructions at home:  Watch your growth for any changes.  Keep all follow-up visits as told by your health care provider. This is important.  Do not scratch or pick at the growth or growths. This can cause them to  become irritated or infected. Contact a health care provider if:  You suddenly have many new growths.  Your growth bleeds, itches, or hurts.  Your growth suddenly becomes larger or changes color. Summary  A seborrheic keratosis is a common, noncancerous (benign) skin growth.  Treatment is not usually needed for this condition, unless the growths are irritated or bleed often.  Watch your growth for any changes.  Contact a health care provider if you suddenly have many new growths or your growth suddenly becomes larger or changes color.  Keep all follow-up visits as told by your health care provider. This is important. This information is not intended to replace advice given to you by your health care provider. Make sure you discuss any questions you have with your health care provider. Document Revised: 06/09/2017 Document Reviewed: 06/09/2017 Elsevier Patient Education  2021 [employer]  Actinic Keratosis An actinic keratosis is a precancerous growth on the skin. If there is more than one growth, the condition is called actinic keratoses. Actinic keratoses appear most often on areas of skin that get a lot of sun exposure, including the scalp, face, ears, lips, upper back, forearms, and the backs of the hands. If left untreated, these growths may develop into a skin cancer called squamous cell carcinoma. It is important to have all these growths checked by a health care provider to determine the best treatment approach. What are the causes? Actinic keratoses are caused by getting too much ultraviolet (UV) radiation from the sun or other UV light sources. What increases  the risk? You are more likely to develop this condition if you:  Have light-colored skin and blue eyes.  Have blond or red hair.  Spend a lot of time in the sun.  Do not protect your skin from the sun when outdoors.  Are an older person. The risk of developing an actinic keratosis increases with age. What are  the signs or symptoms? Actinic keratoses feel like scaly, rough spots of skin. Symptoms of this condition include growths that may:  Be as small as a pinhead or as big as a quarter.  Itch, hurt, or feel sensitive.  Be skin-colored, light tan, dark tan, pink, or a combination of any of these colors. In most cases, the growths become red.  Have a small piece of pink or gray skin (skin tag) growing from them. It may be easier to notice actinic keratoses by feeling them, rather than seeing them. Sometimes, actinic keratoses disappear, but many reappear a few days to a few weeks later.   How is this diagnosed? This condition is usually diagnosed with a physical exam.  A tissue sample may be removed from the actinic keratosis and examined under a microscope (biopsy). How is this treated? If needed, this condition may be treated by:  Scraping off the actinic keratosis (curettage).  Freezing the actinic keratosis with liquid nitrogen (cryosurgery). This causes the growth to eventually fall off the skin.  Applying medicated creams or gels to destroy the cells in the growth.  Applying chemicals to the actinic keratosis to make the outer layers of skin peel off (chemical peel).  Using photodynamic therapy. In this procedure, medicated cream is applied to the actinic keratosis. This cream increases your skin's sensitivity to light. Then, a strong light is aimed at the actinic keratosis to destroy cells in the growth. Follow these instructions at home: Skin care  Apply cool, wet cloths (cool compresses) to the affected areas.  Do not scratch your skin.  Check your skin regularly for any growths, especially growths that: ? Start to itch or bleed. ? Change in size, shape, or color. Caring for the treated area  Keep the treated area clean and dry as told by your health care provider.  Do not apply any medicine, cream, or lotion to the treated area unless your health care provider tells you  to do that.  Do not pick at blisters or try to break them open. This can cause infection and scarring.  If you have red or irritated skin after treatment, follow instructions from your health care provider about how to take care of the treated area. Make sure you: ? Wash your hands with soap and water before you change your bandage (dressing). If soap and water are not available, use hand sanitizer. ? Change your dressing as told by your health care provider.  If you have red or irritated skin after treatment, check your treated area every day for signs of infection. Check for: ? Redness, swelling, or pain. ? Fluid or blood. ? Warmth. ? Pus or a bad smell. General instructions  Take or apply over-the-counter and prescription medicines only as told by your health care provider.  Return to your normal activities as told by your health care provider. Ask your health care provider what activities are safe for you.  Have a skin exam done every year by a health care provider who is a skin specialist (dermatologist).  Keep all follow-up visits as told by your health care provider. This is important.  Lifestyle  Do not use any products that contain nicotine or tobacco, such as cigarettes and e-cigarettes. If you need help quitting, ask your health care provider.  Take steps to protect your skin from the sun. ? Try to avoid the sun between 10:00 a.m. and 4:00 p.m. This is when the UV light is the strongest. ? Use a sunscreen or sunblock with SPF 30 (sun protection factor 30) or greater. ? Apply sunscreen before you are exposed to sunlight and reapply as often as directed by the instructions on the sunscreen container. ? Always wear sunglasses that have UV protection, and always wear a hat and clothing to protect your skin from sunlight. ? When possible, avoid medicines that increase your sensitivity to sunlight. ? Do not use tanning beds or other indoor tanning devices. Contact a health care  provider if:  You notice any changes or new growths on your skin.  You have swelling, pain, or more redness around your treated area.  You have fluid or blood coming from your treated area.  Your treated area feels warm to the touch.  You have pus or a bad smell coming from your treated area.  You have a fever.  You have a blister that becomes large and painful. Summary  An actinic keratosis is a precancerous growth on the skin. If there is more than one growth, the condition is called actinic keratoses. In some cases, if left untreated, these growths can develop into skin cancer.  Check your skin regularly for any growths, especially growths that start to itch or bleed, or change in size, shape, or color.  Take steps to protect your skin from the sun.  Contact a health care provider if you notice any changes or new growths on your skin.  Keep all follow-up visits as told by your health care provider. This is important. This information is not intended to replace advice given to you by your health care provider. Make sure you discuss any questions you have with your health care provider. Document Revised: 06/07/2017 Document Reviewed: 06/07/2017 Elsevier Patient Education  2021 [employer].

## 2020-07-10 ENCOUNTER — Other Ambulatory Visit: Payer: Self-pay

## 2020-07-10 ENCOUNTER — Ambulatory Visit
Admission: RE | Admit: 2020-07-10 | Discharge: 2020-07-10 | Disposition: A | Discharge status: Home / Self Care | Payer: Medicare PPO | Source: Ambulatory Visit | Attending: Internal Medicine | Admitting: Internal Medicine

## 2020-07-10 DIAGNOSIS — Z1231 Encounter for screening mammogram for malignant neoplasm of breast: Secondary | ICD-10-CM | POA: Insufficient documentation

## 2020-07-28 ENCOUNTER — Encounter: Payer: Self-pay | Admitting: Internal Medicine

## 2020-07-28 ENCOUNTER — Telehealth: Payer: Self-pay | Admitting: Internal Medicine

## 2020-07-28 NOTE — Telephone Encounter (Signed)
Inform call back if worse can get covid 19 pill 5 days of testing + but will need virtual visit in office with another provider due to im out of the office   Also can try zyrtec, claritin, xyzal, allegra for cough, runny nose sneezing if has these sx's  Warm salt water gargles sore throat  Warm tea honey and lemon  Otc cough drops

## 2020-07-28 NOTE — Telephone Encounter (Signed)
Called Patient and she states that she is fully vaccinated with moderate symptoms.   Confirmed with Patient that she is not having any SOB, severe headache, chest pain, abdominal pain, diarrhea, nausea/vomiting, or any other sever symptoms.   Patient would like the over the counter Covid list sent to her via mychart. Done.   Patient informed and verbalized understanding to call back should her symptoms worsen.

## 2020-07-28 NOTE — Telephone Encounter (Signed)
PT called to advise that she tested positive with Covid yesterday after having tested negative for Covid on Wednesday. PT would like to know what can be done in regards to feeling better such as meds.

## 2020-07-28 NOTE — Telephone Encounter (Signed)
Patient was informed via previous call to call back in with worsening symptoms. Also discussed with the Patient COVID 19 pill and that her symptoms do not qualify.

## 2020-07-29 ENCOUNTER — Telehealth: Payer: Self-pay | Admitting: Internal Medicine

## 2020-07-29 NOTE — Telephone Encounter (Signed)
Rejection Reason - Patient Declined - Patient cancelled due to COVID" Doreene Eland said on Jul 29, 2020 10:28 AM  Msg from [facility]

## 2020-08-05 NOTE — Telephone Encounter (Signed)
Referral placed pt can call and reschedule dermatology appt when better

## 2020-08-07 NOTE — Telephone Encounter (Signed)
Noted  

## 2020-08-12 ENCOUNTER — Ambulatory Visit (INDEPENDENT_AMBULATORY_CARE_PROVIDER_SITE_OTHER): Payer: Medicare PPO

## 2020-08-12 VITALS — Ht 67.0 in | Wt 165.0 lb

## 2020-08-12 DIAGNOSIS — Z0000 Encounter for general adult medical examination without abnormal findings: Secondary | ICD-10-CM | POA: Diagnosis not present

## 2020-08-12 NOTE — Patient Instructions (Addendum)
Misty Rojas , Thank you for taking time to come for your Medicare Wellness Visit. I appreciate your ongoing commitment to your health goals. Please review the following plan we discussed and let me know if I can assist you in the future.   These are the goals we discussed:  Goals      Maintain healthy lifestyle     Stay active Healthy diet         This is a list of the screening recommended for you and due dates:  Health Maintenance  Topic Date Due   Colon Cancer Screening  Never done   Pneumonia vaccines (1 of 2 - PCV13) 08/12/2021*   Flu Shot  09/08/2020   COVID-19 Vaccine (5 - Booster for Moderna series) 09/19/2020   Mammogram  07/11/2022   Tetanus Vaccine  01/15/2026   DEXA scan (bone density measurement)  Completed   Hepatitis C Screening: USPSTF Recommendation to screen - Ages 34-79 yo.  Completed   Zoster (Shingles) Vaccine  Completed   HPV Vaccine  Aged Out  *Topic was postponed. The date shown is not the original due date.    Advanced directives: not yet completed  Conditions/risks identified: none new  Follow up in one year for your annual wellness visit    Preventive Care 65 Years and Older, Female Preventive care refers to lifestyle choices and visits with your health care provider that can promote health and wellness. What does preventive care include? A yearly physical exam. This is also called an annual well check. Dental exams once or twice a year. Routine eye exams. Ask your health care provider how often you should have your eyes checked. Personal lifestyle choices, including: Daily care of your teeth and gums. Regular physical activity. Eating a healthy diet. Avoiding tobacco and drug use. Limiting alcohol use. Practicing safe sex. Taking low-dose aspirin every day. Taking vitamin and mineral supplements as recommended by your health care provider. What happens during an annual well check? The services and screenings done by your health care  provider during your annual well check will depend on your age, overall health, lifestyle risk factors, and family history of disease. Counseling  Your health care provider may ask you questions about your: Alcohol use. Tobacco use. Drug use. Emotional well-being. Home and relationship well-being. Sexual activity. Eating habits. History of falls. Memory and ability to understand (cognition). Work and work Astronomer. Reproductive health. Screening  You may have the following tests or measurements: Height, weight, and BMI. Blood pressure. Lipid and cholesterol levels. These may be checked every 5 years, or more frequently if you are over 5 years old. Skin check. Lung cancer screening. You may have this screening every year starting at age 9 if you have a 30-pack-year history of smoking and currently smoke or have quit within the past 15 years. Fecal occult blood test (FOBT) of the stool. You may have this test every year starting at age 89. Flexible sigmoidoscopy or colonoscopy. You may have a sigmoidoscopy every 5 years or a colonoscopy every 10 years starting at age 72. Hepatitis C blood test. Hepatitis B blood test. Sexually transmitted disease (STD) testing. Diabetes screening. This is done by checking your blood sugar (glucose) after you have not eaten for a while (fasting). You may have this done every 1-3 years. Bone density scan. This is done to screen for osteoporosis. You may have this done starting at age 63. Mammogram. This may be done every 1-2 years. Talk to your health care  provider about how often you should have regular mammograms. Talk with your health care provider about your test results, treatment options, and if necessary, the need for more tests. Vaccines  Your health care provider may recommend certain vaccines, such as: Influenza vaccine. This is recommended every year. Tetanus, diphtheria, and acellular pertussis (Tdap, Td) vaccine. You may need a Td  booster every 10 years. Zoster vaccine. You may need this after age 54. Pneumococcal 13-valent conjugate (PCV13) vaccine. One dose is recommended after age 9. Pneumococcal polysaccharide (PPSV23) vaccine. One dose is recommended after age 10. Talk to your health care provider about which screenings and vaccines you need and how often you need them. This information is not intended to replace advice given to you by your health care provider. Make sure you discuss any questions you have with your health care provider. Document Released: 02/21/2015 Document Revised: 10/15/2015 Document Reviewed: 11/26/2014 Elsevier Interactive Patient Education  2017 [place] Prevention in the Home Falls can cause injuries. They can happen to people of all ages. There are many things you can do to make your home safe and to help prevent falls. What can I do on the outside of my home? Regularly fix the edges of walkways and driveways and fix any cracks. Remove anything that might make you trip as you walk through a door, such as a raised step or threshold. Trim any bushes or trees on the path to your home. Use bright outdoor lighting. Clear any walking paths of anything that might make someone trip, such as rocks or tools. Regularly check to see if handrails are loose or broken. Make sure that both sides of any steps have handrails. Any raised decks and porches should have guardrails on the edges. Have any leaves, snow, or ice cleared regularly. Use sand or salt on walking paths during winter. Clean up any spills in your garage right away. This includes oil or grease spills. What can I do in the bathroom? Use night lights. Install grab bars by the toilet and in the tub and shower. Do not use towel bars as grab bars. Use non-skid mats or decals in the tub or shower. If you need to sit down in the shower, use a plastic, non-slip stool. Keep the floor dry. Clean up any water that spills on the floor  as soon as it happens. Remove soap buildup in the tub or shower regularly. Attach bath mats securely with double-sided non-slip rug tape. Do not have throw rugs and other things on the floor that can make you trip. What can I do in the bedroom? Use night lights. Make sure that you have a light by your bed that is easy to reach. Do not use any sheets or blankets that are too big for your bed. They should not hang down onto the floor. Have a firm chair that has side arms. You can use this for support while you get dressed. Do not have throw rugs and other things on the floor that can make you trip. What can I do in the kitchen? Clean up any spills right away. Avoid walking on wet floors. Keep items that you use a lot in easy-to-reach places. If you need to reach something above you, use a strong step stool that has a grab bar. Keep electrical cords out of the way. Do not use floor polish or wax that makes floors slippery. If you must use wax, use non-skid floor wax. Do not have throw rugs  and other things on the floor that can make you trip. What can I do with my stairs? Do not leave any items on the stairs. Make sure that there are handrails on both sides of the stairs and use them. Fix handrails that are broken or loose. Make sure that handrails are as long as the stairways. Check any carpeting to make sure that it is firmly attached to the stairs. Fix any carpet that is loose or worn. Avoid having throw rugs at the top or bottom of the stairs. If you do have throw rugs, attach them to the floor with carpet tape. Make sure that you have a light switch at the top of the stairs and the bottom of the stairs. If you do not have them, ask someone to add them for you. What else can I do to help prevent falls? Wear shoes that: Do not have high heels. Have rubber bottoms. Are comfortable and fit you well. Are closed at the toe. Do not wear sandals. If you use a stepladder: Make sure that it is  fully opened. Do not climb a closed stepladder. Make sure that both sides of the stepladder are locked into place. Ask someone to hold it for you, if possible. Clearly mark and make sure that you can see: Any grab bars or handrails. First and last steps. Where the edge of each step is. Use tools that help you move around (mobility aids) if they are needed. These include: Canes. Walkers. Scooters. Crutches. Turn on the lights when you go into a dark area. Replace any light bulbs as soon as they burn out. Set up your furniture so you have a clear path. Avoid moving your furniture around. If any of your floors are uneven, fix them. If there are any pets around you, be aware of where they are. Review your medicines with your doctor. Some medicines can make you feel dizzy. This can increase your chance of falling. Ask your doctor what other things that you can do to help prevent falls. This information is not intended to replace advice given to you by your health care provider. Make sure you discuss any questions you have with your health care provider. Document Released: 11/21/2008 Document Revised: 07/03/2015 Document Reviewed: 03/01/2014 Elsevier Interactive Patient Education  2017 [employer].

## 2020-08-12 NOTE — Progress Notes (Signed)
Subjective:   Misty Rojas is a 65 y.o. female who presents for an Initial Medicare Annual Wellness Visit.  Review of Systems    No ROS.  Medicare Wellness Virtual Visit.  Visual/audio telehealth visit, UTA vital signs.   See social history for additional risk factors.   Cardiac Risk Factors include: advanced age (>68mn, >>11women)     Objective:    Today's Vitals   08/12/20 1454  Weight: 165 lb (74.8 kg)  Height: '5\' 7"'  (1.702 m)   Body mass index is 25.84 kg/m.  Advanced Directives 08/12/2020 02/09/2018  Does Patient Have a Medical Advance Directive? No No  Type of AParamedicof [place]Ham[place]Living will -  Does patient want to make changes to medical advance directive? No - Patient declined -  Copy of HLos Berrosin Chart? No - copy requested -    Current Medications (verified) Outpatient Encounter Medications as of 08/12/2020  Medication Sig   buPROPion (WELLBUTRIN XL) 150 MG 24 hr tablet Take 1 tablet (150 mg total) by mouth daily.   Cholecalciferol (VITAMIN D-3) 1000 units CAPS Take 4 capsules by mouth daily.   fluticasone (FLONASE) 50 MCG/ACT nasal spray Place 2 sprays into both nostrils daily as needed.   Omega-3 Fatty Acids (FISH OIL) 1200 MG CAPS Take 1 capsule by mouth daily.   No facility-administered encounter medications on file as of 08/12/2020.    Allergies (verified) Patient has no known allergies.   History: Past Medical History:  Diagnosis Date   COVID-19    07/27/20   Family history of breast cancer    mother and m. aunt    Lateral epicondylitis    Medial epicondylitis    Medial epicondylitis    right    Rosacea    Past Surgical History:  Procedure Laterality Date   WISDOM TOOTH EXTRACTION     Family History  Problem Relation Age of Onset   Arthritis Mother    Cancer Mother        breast    Breast cancer Mother [phone]  Cancer Father        prostate, kidney with mets   Alcohol abuse Sister    Cancer  Sister    Early death Sister    Cancer Maternal Aunt        breast    Breast cancer Maternal Aunt [phone]  Arthritis Sister    Social History   Socioeconomic History   Marital status: Married    Spouse name: Not on file   Number of children: Not on file   Years of education: Not on file   Highest education level: Not on file  Occupational History   Not on file  Tobacco Use   Smoking status: Never   Smokeless tobacco: Never  Vaping Use   Vaping Use: Never used  Substance and Sexual Activity   Alcohol use: No   Drug use: No   Sexual activity: Not on file  Other Topics Concern   Not on file  Social History Narrative   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    2 children    Married    Social Determinants of HRadio broadcast assistantStrain: Low Risk    Difficulty of Paying Living Expenses: Not hard at all  Food Insecurity: No Food Insecurity   Worried About RCharity fundraiserin the Last Year: Never true   [place]in the Last Year: Never true  Transportation Needs: No Data processing manager (Medical): No   Lack of Transportation (Non-Medical): No  Physical Activity: Sufficiently Active   Days of Exercise per Week: 5 days   Minutes of Exercise per Session: 30 min  Stress: No Stress Concern Present   Feeling of Stress : Not at all  Social Connections: Unknown   Frequency of Communication with Friends and Family: More than three times a week   Frequency of Social Gatherings with Friends and Family: Not on file   Attends Religious Services: Not on Electrical engineer or Organizations: Not on file   Attends Archivist Meetings: Not on file   Marital Status: Married    Tobacco Counseling Counseling given: Not Answered   Clinical Intake:  Pre-visit preparation completed: Yes        Diabetes: No  How often do you need to have someone help you when you read instructions, pamphlets, or other written materials from your  doctor or pharmacy?: 1 - Never   Interpreter Needed?: No      Activities of Daily Living In your present state of health, do you have any difficulty performing the following activities: 08/12/2020  Hearing? N  Vision? N  Difficulty concentrating or making decisions? N  Walking or climbing stairs? N  Dressing or bathing? N  Doing errands, shopping? N  Preparing Food and eating ? N  Using the Toilet? N  In the past six months, have you accidently leaked urine? N  Do you have problems with loss of bowel control? N  Managing your Medications? N  Managing your Finances? N  Housekeeping or managing your Housekeeping? N  Some recent data might be hidden    Patient Care Team: McLean-Scocuzza, Nino Glow, MD as PCP - General (Internal Medicine)  Indicate any recent Medical Services you may have received from other than [facility] providers in the past year (date may be approximate).     Assessment:   This is a routine wellness examination for Misty Rojas.  I connected with [name] today by telephone and verified that I am speaking with the correct person using two identifiers. Location patient: home Location provider: work Persons participating in the virtual visit: patient, Misty Rojas.    I discussed the limitations, risks, security and privacy concerns of performing an evaluation and management service by telephone and the availability of in person appointments. The patient expressed understanding and verbally consented to this telephonic visit.    Interactive audio and video telecommunications were attempted between this provider and patient, however failed, due to patient having technical difficulties OR patient did not have access to video capability.  We continued and completed visit with audio only.  Some vital signs may be absent or patient reported.   Hearing/Vision screen Hearing Screening - Comments:: Patient is able to hear conversational tones without difficulty.  No issues  reported. Vision Screening - Comments:: Wears corrective lenses They have seen their ophthalmologist in the last 12 months.    Dietary issues and exercise activities discussed: Current Exercise Habits: Home exercise routine, Type of exercise: walking, Intensity: Moderate Regular diet Good water intake   Goals Addressed             This Visit's Progress    Maintain healthy lifestyle       Stay active Healthy diet        Depression Screen PHQ 2/9 Scores 08/12/2020 07/09/2020 04/19/2019  PHQ - 2 Score 0 0 0  Fall Risk Fall Risk  08/12/2020 07/09/2020 01/29/2020 04/19/2019 04/06/2018  Falls in the past year? 0 0 0 0 0  Number falls in past yr: 0 0 0 0 -  Injury with Fall? - 0 0 0 -  Follow up - Falls evaluation completed Falls evaluation completed Falls evaluation completed -    FALL RISK PREVENTION PERTAINING TO THE HOME: Handrails in ujsew when climbing stairs? Yes Home free of loose throw rugs in walkways, pet beds, electrical cords, etc? Yes  Adequate lighting in your home to reduce risk of falls? Yes   ASSISTIVE DEVICES UTILIZED TO PREVENT FALLS: Life alert? No  Use of a cane, walker or w/c? No   TIMED UP AND GO: Was the test performed? No .   Cognitive Function:  Patient is alert and oriented x3.  Denies difficulty focusing, making decisions, memory loss.  MMSE/6CIT deferred. Normal by direct communication/observation.      Immunizations Immunization History  Administered Date(s) Administered   Influenza, High Dose Seasonal PF 11/09/2019   Influenza,inj,Quad PF,6+ Mos 12/03/2015, 11/21/2017, 10/17/2018   Influenza,inj,quad, With Preservative 12/03/2015   Influenza-Unspecified 11/08/2016, 11/21/2017   MMR 07/08/2020   Moderna SARS-COV2 Booster Vaccination 12/04/2019, 05/20/2020   Moderna Sars-Covid-2 Vaccination 05/07/2019, 06/04/2019   Tdap 01/16/2016   Zoster Recombinat (Shingrix) 04/19/2017, 06/30/2017   PNA vaccine- deferred at this time. Colonoscopy-  deferred.   Health Maintenance Health Maintenance  Topic Date Due   COLONOSCOPY (Pts 45-37yr Insurance coverage will need to be confirmed)  Never done   PNA vac Low Risk Adult (1 of 2 - PCV13) 08/12/2021 (Originally 08/12/2019)   INFLUENZA VACCINE  09/08/2020   COVID-19 Vaccine (5 - Booster for Moderna series) 09/19/2020   MAMMOGRAM  07/11/2022   TETANUS/TDAP  01/15/2026   DEXA SCAN  Completed   Hepatitis C Screening  Completed   Zoster Vaccines- Shingrix  Completed   HPV VACCINES  Aged Out   Lung Cancer Screening: (Low Dose CT Chest recommended if Age 66-80years, 30 pack-year currently smoking OR have quit w/in 15years.) does not qualify.   Dental Screening: Recommended annual dental exams for proper oral hygiene.  Community Resource Referral / Chronic Care Management: CRR required this visit?  No   CCM required this visit?  No      Plan:   Keep all routine maintenance appointments.   I have personally reviewed and noted the following in the patient's chart:   Medical and social history Use of alcohol, tobacco or illicit drugs  Current medications and supplements including opioid prescriptions. Patient is not currently taking opioid prescriptions. Functional ability and status Nutritional status Physical activity Advanced directives List of other physicians Hospitalizations, surgeries, and ER visits in previous 12 months Vitals Screenings to include cognitive, depression, and falls Referrals and appointments  In addition, I have reviewed and discussed with patient certain preventive protocols, quality metrics, and best practice recommendations. A written personalized care plan for preventive services as well as general preventive health recommendations were provided to patient via mychart.    OVarney Biles LPN   [id number]

## 2020-08-13 ENCOUNTER — Ambulatory Visit: Payer: Medicare PPO

## 2020-11-21 ENCOUNTER — Ambulatory Visit: Payer: Medicare PPO | Attending: Internal Medicine

## 2020-11-21 ENCOUNTER — Other Ambulatory Visit: Payer: Self-pay

## 2020-11-21 DIAGNOSIS — Z23 Encounter for immunization: Secondary | ICD-10-CM

## 2020-11-21 MED ORDER — MODERNA COVID-19 BIVAL BOOSTER 50 MCG/0.5ML IM SUSP
INTRAMUSCULAR | 0 refills | Status: DC
Start: 2020-11-21 — End: 2021-01-29
  Filled 2020-11-21: qty 0.5, 1d supply, fill #0

## 2020-11-21 NOTE — Progress Notes (Signed)
   Covid-19 Vaccination Clinic  Name:  Misty Rojas    MRN: [id number] DOB: Jan 12, 1956  11/21/2020  Misty Rojas was observed post Covid-19 immunization for 15 minutes without incident. She was provided with Vaccine Information Sheet and instruction to access the V-Safe system.   Misty Rojas was instructed to call 911 with any severe reactions post vaccine: Difficulty breathing  Swelling of face and throat  A fast heartbeat  A bad rash all over body  Dizziness and weakness   Drusilla Kanner, PharmD, MBA Clinical Acute Care Pharmacist

## 2020-12-11 ENCOUNTER — Other Ambulatory Visit: Payer: Self-pay | Admitting: Internal Medicine

## 2020-12-11 DIAGNOSIS — F32A Depression, unspecified: Secondary | ICD-10-CM

## 2021-01-10 ENCOUNTER — Other Ambulatory Visit: Payer: Self-pay

## 2021-01-10 ENCOUNTER — Ambulatory Visit
Admission: EM | Admit: 2021-01-10 | Discharge: 2021-01-10 | Disposition: A | Discharge status: Home / Self Care | Payer: Medicare PPO | Attending: Emergency Medicine | Admitting: Emergency Medicine

## 2021-01-10 ENCOUNTER — Encounter: Payer: Self-pay | Admitting: Emergency Medicine

## 2021-01-10 DIAGNOSIS — J0100 Acute maxillary sinusitis, unspecified: Secondary | ICD-10-CM

## 2021-01-10 MED ORDER — AMOXICILLIN-POT CLAVULANATE 875-125 MG PO TABS: 1.0000 | ORAL_TABLET | Freq: Two times a day (BID) | ORAL | 0 refills | Status: DC

## 2021-01-10 NOTE — ED Triage Notes (Signed)
Pt here with URI sx and low grade fevers x 10 days. Has tried many OTC therapies without success.

## 2021-01-10 NOTE — Discharge Instructions (Addendum)
Take the Augmentin as directed.  Follow up with your primary care provider if your symptoms are not improving.    

## 2021-01-10 NOTE — ED Provider Notes (Signed)
[facility]    CSN: [id number] Arrival date & time: 01/10/21  1159      History   Chief Complaint Chief Complaint  Patient presents with   URI    HPI Misty Rojas is a 65 y.o. female.  Patient presents with 1.5 week history of low grade fever, sinus congestion, sinus pressure, postnasal drip, sore throat, cough.  Not improving with OTC treatment.  No rash, shortness of breath, diarrhea, or other symptoms.  History of COVID in June 2022.  The history is provided by the patient and medical records.   Past Medical History:  Diagnosis Date   COVID-19    07/27/20   Family history of breast cancer    mother and m. aunt    Lateral epicondylitis    Medial epicondylitis    Medial epicondylitis    right    Rosacea     Patient Active Problem List   Diagnosis Date Noted   Osteopenia 07/10/2019   Cough 03/14/2019   Pansinusitis 01/19/2018   Bilateral impacted cerumen 01/19/2018   Family history of breast cancer 01/28/2017   Rosacea 01/28/2017   Medial epicondylitis 01/28/2017   Lateral epicondylitis 01/28/2017   Annual physical exam 01/28/2017    Past Surgical History:  Procedure Laterality Date   WISDOM TOOTH EXTRACTION      OB History   No obstetric history on file.      Home Medications    Prior to Admission medications   Medication Sig Start Date End Date Taking? Authorizing Provider  amoxicillin-clavulanate (AUGMENTIN) 875-125 MG tablet Take 1 tablet by mouth every 12 (twelve) hours. 01/10/21  Yes Mickie Bail, NP  buPROPion (WELLBUTRIN XL) 150 MG 24 hr tablet TAKE 1 TABLET BY MOUTH ONCE A DAY 12/11/20   McLean-Scocuzza, Pasty Spillers, MD  Cholecalciferol (VITAMIN D-3) 1000 units CAPS Take 4 capsules by mouth daily.    [provider]  COVID-19 mRNA bivalent vaccine, Moderna, (MODERNA COVID-19 BIVAL BOOSTER) 50 MCG/0.5ML injection Inject into the muscle. 11/21/20   Judyann Munson, MD  fluticasone (FLONASE) 50 MCG/ACT nasal spray Place 2 sprays  into both nostrils daily as needed. 01/29/20   McLean-Scocuzza, Pasty Spillers, MD  Omega-3 Fatty Acids (FISH OIL) 1200 MG CAPS Take 1 capsule by mouth daily.    [provider]    Family History Family History  Problem Relation Age of Onset   Arthritis Mother    Cancer Mother        breast    Breast cancer Mother 38   Cancer Father        prostate, kidney with mets   Alcohol abuse Sister    Cancer Sister    Early death Sister    Cancer Maternal Aunt        breast    Breast cancer Maternal Aunt 55   Arthritis Sister     Social History Social History   Tobacco Use   Smoking status: Never   Smokeless tobacco: Never  Vaping Use   Vaping Use: Never used  Substance Use Topics   Alcohol use: No   Drug use: No     Allergies   Patient has no known allergies.   Review of Systems Review of Systems  Constitutional:  Positive for fever. Negative for chills.  HENT:  Positive for congestion, postnasal drip, rhinorrhea, sinus pressure and sore throat. Negative for ear pain.   Respiratory:  Positive for cough. Negative for shortness of breath.   Cardiovascular:  Negative  for chest pain and palpitations.  Gastrointestinal:  Negative for abdominal pain, diarrhea and vomiting.  Skin:  Negative for color change and rash.  All other systems reviewed and are negative.   Physical Exam Triage Vital Signs ED Triage Vitals  Enc Vitals Group     BP 01/10/21 1320 (!) 146/82     Pulse Rate 01/10/21 1320 100     Resp 01/10/21 1320 20     Temp 01/10/21 1320 99.8 F (37.7 C)     Temp Source 01/10/21 1320 Oral     SpO2 01/10/21 1320 98 %     Weight --      Height --      Head Circumference --      Peak Flow --      Pain Score 01/10/21 1322 4     Pain Loc --      Pain Edu? --      Excl. in [place]? --    No data found.  Updated Vital Signs BP (!) 146/82   Pulse 100   Temp 99.8 F (37.7 C) (Oral)   Resp 20   SpO2 98%   Visual Acuity Right Eye Distance:   Left Eye  Distance:   Bilateral Distance:    Right Eye Near:   Left Eye Near:    Bilateral Near:     Physical Exam Vitals and nursing note reviewed.  Constitutional:      General: She is not in acute distress.    Appearance: She is well-developed.  HENT:     Right Ear: Tympanic membrane normal.     Left Ear: Tympanic membrane normal.     Nose: Congestion and rhinorrhea present.     Mouth/Throat:     Mouth: Mucous membranes are moist.     Pharynx: Oropharynx is clear.  Cardiovascular:     Rate and Rhythm: Normal rate and regular rhythm.     Heart sounds: Normal heart sounds.  Pulmonary:     Effort: Pulmonary effort is normal. No respiratory distress.     Breath sounds: Normal breath sounds.  Abdominal:     Palpations: Abdomen is soft.     Tenderness: There is no abdominal tenderness.  Musculoskeletal:     Cervical back: Neck supple.  Skin:    General: Skin is warm and dry.  Neurological:     Mental Status: She is alert.  Psychiatric:        Mood and Affect: Mood normal.        Behavior: Behavior normal.     UC Treatments / Results  Labs (all labs ordered are listed, but only abnormal results are displayed) Labs Reviewed - No data to display  EKG   Radiology No results found.  Procedures Procedures (including critical care time)  Medications Ordered in UC Medications - No data to display  Initial Impression / Assessment and Plan / UC Course  I have reviewed the triage vital signs and the nursing notes.  Pertinent labs & imaging results that were available during my care of the patient were reviewed by me and considered in my medical decision making (see chart for details).   Acute sinusitis.  Patient has been symptomatic for 1.5 weeks.  Symptoms are not improving with OTC treatment.  Treating today with Augmentin.  Instructed her to take Tylenol or ibuprofen as needed for fever or discomfort.  Instructed her to follow-up with her PCP if her symptoms are not  improving.  She agrees to plan  of care.   Final Clinical Impressions(s) / UC Diagnoses   Final diagnoses:  Acute non-recurrent maxillary sinusitis     Discharge Instructions      Take the Augmentin as directed.  Follow up with your primary care provider if your symptoms are not improving.         ED Prescriptions     Medication Sig Dispense Auth. Provider   amoxicillin-clavulanate (AUGMENTIN) 875-125 MG tablet Take 1 tablet by mouth every 12 (twelve) hours. 14 tablet Sharion Balloon, NP      PDMP not reviewed this encounter.   Sharion Balloon, NP 01/10/21 1356

## 2021-01-29 ENCOUNTER — Encounter: Payer: Self-pay | Admitting: Internal Medicine

## 2021-01-29 ENCOUNTER — Other Ambulatory Visit: Payer: Self-pay

## 2021-01-29 ENCOUNTER — Ambulatory Visit (INDEPENDENT_AMBULATORY_CARE_PROVIDER_SITE_OTHER): Payer: Medicare PPO | Admitting: Internal Medicine

## 2021-01-29 VITALS — BP 118/70 | Temp 97.3°F | Ht 66.42 in | Wt 160.8 lb

## 2021-01-29 DIAGNOSIS — E785 Hyperlipidemia, unspecified: Secondary | ICD-10-CM

## 2021-01-29 DIAGNOSIS — M8580 Other specified disorders of bone density and structure, unspecified site: Secondary | ICD-10-CM | POA: Diagnosis not present

## 2021-01-29 DIAGNOSIS — N3000 Acute cystitis without hematuria: Secondary | ICD-10-CM | POA: Diagnosis not present

## 2021-01-29 DIAGNOSIS — Z1231 Encounter for screening mammogram for malignant neoplasm of breast: Secondary | ICD-10-CM

## 2021-01-29 DIAGNOSIS — H7291 Unspecified perforation of tympanic membrane, right ear: Secondary | ICD-10-CM

## 2021-01-29 DIAGNOSIS — Z0000 Encounter for general adult medical examination without abnormal findings: Secondary | ICD-10-CM

## 2021-01-29 DIAGNOSIS — Z1283 Encounter for screening for malignant neoplasm of skin: Secondary | ICD-10-CM

## 2021-01-29 DIAGNOSIS — Z1211 Encounter for screening for malignant neoplasm of colon: Secondary | ICD-10-CM

## 2021-01-29 LAB — CBC WITH DIFFERENTIAL/PLATELET
Basophils Absolute: 0 10*3/uL (ref 0.0–0.1)
Basophils Relative: 0.9 % (ref 0.0–3.0)
Eosinophils Absolute: 0.3 10*3/uL (ref 0.0–0.7)
Eosinophils Relative: 7.1 % — ABNORMAL HIGH (ref 0.0–5.0)
HCT: 41.8 % (ref 36.0–46.0)
Hemoglobin: 13.9 g/dL (ref 12.0–15.0)
Lymphocytes Relative: 42.8 % (ref 12.0–46.0)
Lymphs Abs: 1.7 10*3/uL (ref 0.7–4.0)
MCHC: 33.2 g/dL (ref 30.0–36.0)
MCV: 94.6 fl (ref 78.0–100.0)
Monocytes Absolute: 0.4 10*3/uL (ref 0.1–1.0)
Monocytes Relative: 9 % (ref 3.0–12.0)
Neutro Abs: 1.6 10*3/uL (ref 1.4–7.7)
Neutrophils Relative %: 40.2 % — ABNORMAL LOW (ref 43.0–77.0)
Platelets: 280 10*3/uL (ref 150.0–400.0)
RBC: 4.42 Mil/uL (ref 3.87–5.11)
RDW: 13.2 % (ref 11.5–15.5)
WBC: 4.1 10*3/uL (ref 4.0–10.5)

## 2021-01-29 LAB — LIPID PANEL
Cholesterol: 186 mg/dL (ref 0–200)
HDL: 55.7 mg/dL (ref >39.00)
LDL Cholesterol: 115 mg/dL — ABNORMAL HIGH (ref 0–99)
NonHDL: 130.29
Total CHOL/HDL Ratio: 3
Triglycerides: 74 mg/dL (ref 0.0–149.0)
VLDL: 14.8 mg/dL (ref 0.0–40.0)

## 2021-01-29 LAB — COMPREHENSIVE METABOLIC PANEL
ALT: 19 U/L (ref 0–35)
AST: 26 U/L (ref 0–37)
Albumin: 4.1 g/dL (ref 3.5–5.2)
Alkaline Phosphatase: 125 U/L — ABNORMAL HIGH (ref 39–117)
BUN: 16 mg/dL (ref 6–23)
CO2: 31 mEq/L (ref 19–32)
Calcium: 9.5 mg/dL (ref 8.4–10.5)
Chloride: 102 mEq/L (ref 96–112)
Creatinine, Ser: 0.99 mg/dL (ref 0.40–1.20)
GFR: 59.44 mL/min — ABNORMAL LOW (ref >60.00)
Glucose, Bld: 88 mg/dL (ref 70–99)
Potassium: 4.6 mEq/L (ref 3.5–5.1)
Sodium: 138 mEq/L (ref 135–145)
Total Bilirubin: 1.1 mg/dL (ref 0.2–1.2)
Total Protein: 7.1 g/dL (ref 6.0–8.3)

## 2021-01-29 NOTE — Progress Notes (Signed)
Chief Complaint  Patient presents with   Annual Exam   F/u  1. Elevated alkaline phos  2. Mood controlled on wellbutrin xl 150 mg qd   Review of Systems  Constitutional:  Negative for weight loss.  HENT:  Negative for hearing loss.   Eyes:  Negative for blurred vision.  Respiratory:  Negative for shortness of breath.   Cardiovascular:  Negative for chest pain.  Gastrointestinal:  Negative for abdominal pain and blood in stool.  Genitourinary:  Negative for dysuria.  Musculoskeletal:  Negative for falls and joint pain.  Skin:  Negative for rash.  Neurological:  Negative for headaches.  Psychiatric/Behavioral:  Negative for depression.   Past Medical History:  Diagnosis Date   COVID-19    07/27/20   Family history of breast cancer    mother and m. aunt    Lateral epicondylitis    Medial epicondylitis    Medial epicondylitis    right    Rosacea    Sinusitis    Past Surgical History:  Procedure Laterality Date   WISDOM TOOTH EXTRACTION     Family History  Problem Relation Age of Onset   Arthritis Mother    Cancer Mother        breast    Breast cancer Mother 30   Cancer Father        prostate, kidney with mets   Alcohol abuse Sister    Cancer Sister    Early death Sister    Cancer Maternal Aunt        breast    Breast cancer Maternal Aunt 35   Arthritis Sister    Social History   Socioeconomic History   Marital status: Married    Spouse name: Not on file   Number of children: Not on file   Years of education: Not on file   Highest education level: Not on file  Occupational History   Not on file  Tobacco Use   Smoking status: Never   Smokeless tobacco: Never  Vaping Use   Vaping Use: Never used  Substance and Sexual Activity   Alcohol use: No   Drug use: No   Sexual activity: Not on file  Other Topics Concern   Not on file  Social History Narrative   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    2 children    Married    Social Determinants of Systems developer Strain: Low Risk    Difficulty of Paying Living Expenses: Not hard at all  Food Insecurity: No Food Insecurity   Worried About Charity fundraiser in the Last Year: Never true   Arboriculturist in the Last Year: Never true  Transportation Needs: No Transportation Needs   Lack of Transportation (Medical): No   Lack of Transportation (Non-Medical): No  Physical Activity: Sufficiently Active   Days of Exercise per Week: 5 days   Minutes of Exercise per Session: 30 min  Stress: No Stress Concern Present   Feeling of Stress : Not at all  Social Connections: Unknown   Frequency of Communication with Friends and Family: More than three times a week   Frequency of Social Gatherings with Friends and Family: Not on file   Attends Religious Services: Not on file   Active Member of Clubs or Organizations: Not on file   Attends Archivist Meetings: Not on file   Marital Status: Married  Intimate Partner Violence: Not At Risk   Fear of Current  or Ex-Partner: No   Emotionally Abused: No   Physically Abused: No   Sexually Abused: No   Current Meds  Medication Sig   buPROPion (WELLBUTRIN XL) 150 MG 24 hr tablet TAKE 1 TABLET BY MOUTH ONCE A DAY   Calcium Carbonate (CALCIUM 600 PO) Take 600 mg by mouth 2 (two) times daily.   Cholecalciferol (VITAMIN D-3) 1000 units CAPS Take 4 capsules by mouth daily.   fluticasone (FLONASE) 50 MCG/ACT nasal spray Place 2 sprays into both nostrils daily as needed.   Omega-3 Fatty Acids (FISH OIL) 1200 MG CAPS Take 1 capsule by mouth daily.   No Known Allergies No results found for this or any previous visit (from the past 2160 hour(s)). Objective  Body mass index is 25.63 kg/m. Wt Readings from Last 3 Encounters:  01/29/21 160 lb 12.8 oz (72.9 kg)  08/12/20 165 lb (74.8 kg)  07/09/20 165 lb 12.8 oz (75.2 kg)   Temp Readings from Last 3 Encounters:  01/29/21 (!) 97.3 F (36.3 C) (Temporal)  01/10/21 99.8 F (37.7 C) (Oral)  07/09/20  97.7 F (36.5 C) (Oral)   BP Readings from Last 3 Encounters:  01/29/21 118/70  01/10/21 (!) 146/82  07/09/20 130/70   Pulse Readings from Last 3 Encounters:  01/10/21 100  07/09/20 82  01/29/20 81    Physical Exam Vitals and nursing note reviewed.  Constitutional:      Appearance: Normal appearance. She is well-developed and well-groomed.  HENT:     Head: Normocephalic and atraumatic.  Eyes:     Conjunctiva/sclera: Conjunctivae normal.     Pupils: Pupils are equal, round, and reactive to light.  Cardiovascular:     Rate and Rhythm: Normal rate and regular rhythm.     Heart sounds: Normal heart sounds. No murmur heard. Pulmonary:     Effort: Pulmonary effort is normal.     Breath sounds: Normal breath sounds.  Abdominal:     General: Abdomen is flat. Bowel sounds are normal.     Tenderness: There is no abdominal tenderness.  Musculoskeletal:        General: No tenderness.  Skin:    General: Skin is warm and dry.  Neurological:     General: No focal deficit present.     Mental Status: She is alert and oriented to person, place, and time. Mental status is at baseline.     Cranial Nerves: Cranial nerves 2-12 are intact.     Gait: Gait is intact.  Psychiatric:        Attention and Perception: Attention and perception normal.        Mood and Affect: Mood and affect normal.        Speech: Speech normal.        Behavior: Behavior normal. Behavior is cooperative.        Thought Content: Thought content normal.        Cognition and Memory: Cognition and memory normal.        Judgment: Judgment normal.    Assessment  Plan  Hyperlipidemia, unspecified hyperlipidemia type - Plan: Lipid panel   Acute cystitis without hematuria - Plan: Urine Culture  hm utd flu shot  Consider prevnar and pna 23 in 1 year in future  ?pna 23 3 years ago as of 01/2020 due 2nd part 2 had 1 at [facility] -call back and let me know which pna 23 vaccine she had  Tdap had  01/16/16 shingrix had 2/2 doses  rec MMR vaccine  had MMR vaccine 07/08/20 3rd booster low measles health dept covid vx moderna 4/4utd Hep B immune  Hep C neg Reviewed labs 04/22/20   Pap 12 21/18 due 01/2020 neg neg HPV cervical polyp removed OB/GYN -pap 01/29/20 neg neg HPV   cologuard 04/24/2018 negative  repeat in 3 years  H/o colonoscopy in 50s Referred colonoscopy   Never smoker    07/10/19 osteopenia DEXA consider repeat in 3-5 years mammogram 07/10/19 neg sch 07/10/20  Ordered   Consider healthy diet choices and exercise  D3 4000 IU daily   Derm referred 07/09/20    Consider EKG at f/u baseline Consider renal in the future    Provider: Dr. Olivia Mackie McLean-Scocuzza-Internal Medicine

## 2021-01-29 NOTE — Patient Instructions (Signed)
MD Physician   Primary Contact Information  Phone Fax E-mail Address  [phone] Not available [address]   [place] [address]     Specialties     Gastroenterology      Dr. Vira Agar

## 2021-01-30 LAB — URINE CULTURE
MICRO NUMBER:: 12790852
Result:: NO GROWTH
SPECIMEN QUALITY:: ADEQUATE

## 2021-02-03 ENCOUNTER — Telehealth: Payer: Self-pay | Admitting: Internal Medicine

## 2021-02-03 NOTE — Telephone Encounter (Signed)
Rejection Reason - Patient Declined - Patient cancelled appointment and did not reschedule..mb" Doreene Eland said on Jan 29, 2021 2:11 PM  Msg from [facility]

## 2021-02-04 ENCOUNTER — Encounter: Payer: Self-pay | Admitting: Internal Medicine

## 2021-02-04 NOTE — Telephone Encounter (Signed)
Added to Patient's immunization list, Please advise

## 2021-03-09 ENCOUNTER — Other Ambulatory Visit: Payer: Self-pay | Admitting: Internal Medicine

## 2021-03-09 DIAGNOSIS — J324 Chronic pansinusitis: Secondary | ICD-10-CM

## 2021-04-28 DIAGNOSIS — L570 Actinic keratosis: Secondary | ICD-10-CM | POA: Diagnosis not present

## 2021-04-28 DIAGNOSIS — D1801 Hemangioma of skin and subcutaneous tissue: Secondary | ICD-10-CM | POA: Diagnosis not present

## 2021-04-28 DIAGNOSIS — D2262 Melanocytic nevi of left upper limb, including shoulder: Secondary | ICD-10-CM | POA: Diagnosis not present

## 2021-04-28 DIAGNOSIS — D2272 Melanocytic nevi of left lower limb, including hip: Secondary | ICD-10-CM | POA: Diagnosis not present

## 2021-04-28 DIAGNOSIS — D235 Other benign neoplasm of skin of trunk: Secondary | ICD-10-CM | POA: Diagnosis not present

## 2021-04-28 DIAGNOSIS — L814 Other melanin hyperpigmentation: Secondary | ICD-10-CM | POA: Diagnosis not present

## 2021-04-30 DIAGNOSIS — Z1211 Encounter for screening for malignant neoplasm of colon: Secondary | ICD-10-CM | POA: Diagnosis not present

## 2021-04-30 DIAGNOSIS — K5730 Diverticulosis of large intestine without perforation or abscess without bleeding: Secondary | ICD-10-CM | POA: Diagnosis not present

## 2021-05-09 ENCOUNTER — Other Ambulatory Visit: Payer: Self-pay | Admitting: Internal Medicine

## 2021-05-09 DIAGNOSIS — J324 Chronic pansinusitis: Secondary | ICD-10-CM

## 2021-05-27 DIAGNOSIS — H11151 Pinguecula, right eye: Secondary | ICD-10-CM | POA: Diagnosis not present

## 2021-05-27 DIAGNOSIS — H43813 Vitreous degeneration, bilateral: Secondary | ICD-10-CM | POA: Diagnosis not present

## 2021-05-27 DIAGNOSIS — H2513 Age-related nuclear cataract, bilateral: Secondary | ICD-10-CM | POA: Diagnosis not present

## 2021-06-29 DIAGNOSIS — L718 Other rosacea: Secondary | ICD-10-CM | POA: Diagnosis not present

## 2021-06-29 DIAGNOSIS — L578 Other skin changes due to chronic exposure to nonionizing radiation: Secondary | ICD-10-CM | POA: Diagnosis not present

## 2021-06-29 DIAGNOSIS — L570 Actinic keratosis: Secondary | ICD-10-CM | POA: Diagnosis not present

## 2021-07-20 ENCOUNTER — Other Ambulatory Visit: Payer: Self-pay | Admitting: Internal Medicine

## 2021-07-20 DIAGNOSIS — J324 Chronic pansinusitis: Secondary | ICD-10-CM

## 2021-08-01 ENCOUNTER — Encounter: Payer: Self-pay | Admitting: Internal Medicine

## 2021-08-05 ENCOUNTER — Ambulatory Visit: Payer: Medicare PPO | Admitting: Family

## 2021-08-13 NOTE — Telephone Encounter (Signed)
Can you check on ENT referral and call pt?  I have not heard from the ENT office about this urgent referral. I have called them twice but they would not make an appt for me because the referral was not in their system. I know it was sent but they evidently have not processed it. Should I wait on them or should a referral to another office be made? Thanks!

## 2021-08-17 ENCOUNTER — Ambulatory Visit: Payer: Medicare PPO

## 2021-08-31 ENCOUNTER — Ambulatory Visit
Admission: RE | Admit: 2021-08-31 | Discharge: 2021-08-31 | Disposition: A | Discharge status: Home / Self Care | Payer: Medicare PPO | Source: Ambulatory Visit | Attending: Internal Medicine | Admitting: Internal Medicine

## 2021-08-31 DIAGNOSIS — Z1231 Encounter for screening mammogram for malignant neoplasm of breast: Secondary | ICD-10-CM

## 2021-08-31 DIAGNOSIS — Z13820 Encounter for screening for osteoporosis: Secondary | ICD-10-CM | POA: Insufficient documentation

## 2021-08-31 DIAGNOSIS — Z780 Asymptomatic menopausal state: Secondary | ICD-10-CM | POA: Diagnosis not present

## 2021-08-31 DIAGNOSIS — M8589 Other specified disorders of bone density and structure, multiple sites: Secondary | ICD-10-CM | POA: Diagnosis not present

## 2021-08-31 DIAGNOSIS — M8580 Other specified disorders of bone density and structure, unspecified site: Secondary | ICD-10-CM | POA: Insufficient documentation

## 2021-09-04 DIAGNOSIS — H6981 Other specified disorders of Eustachian tube, right ear: Secondary | ICD-10-CM | POA: Diagnosis not present

## 2021-09-04 DIAGNOSIS — H6122 Impacted cerumen, left ear: Secondary | ICD-10-CM | POA: Diagnosis not present

## 2021-09-04 DIAGNOSIS — H6521 Chronic serous otitis media, right ear: Secondary | ICD-10-CM | POA: Diagnosis not present

## 2021-09-15 IMAGING — NM NM BONE WHOLE BODY
2 series · 6 of 6 positions shown · non-contrast
Comparison: None.

CLINICAL DATA: Elevated alkaline phosphatase. Elevated liver
enzymes.

EXAM:
NUCLEAR MEDICINE WHOLE BODY BONE SCAN
TECHNIQUE: Whole body anterior and posterior images were obtained approximately
3 hours after intravenous injection of radiopharmaceutical.
RADIOPHARMACEUTICALS:  23.6 mCi Yechnetium-FFm MDP IV

[Series 1000: 3 hr wholebody · 2.40mm/px · 2 of 2 frames shown]
[frame 1/2]
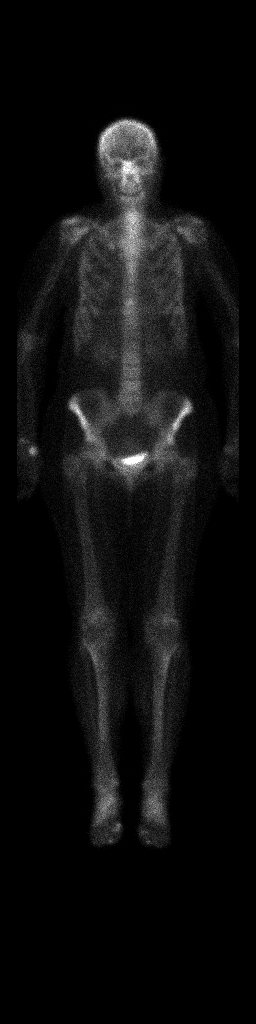
[frame 2/2]
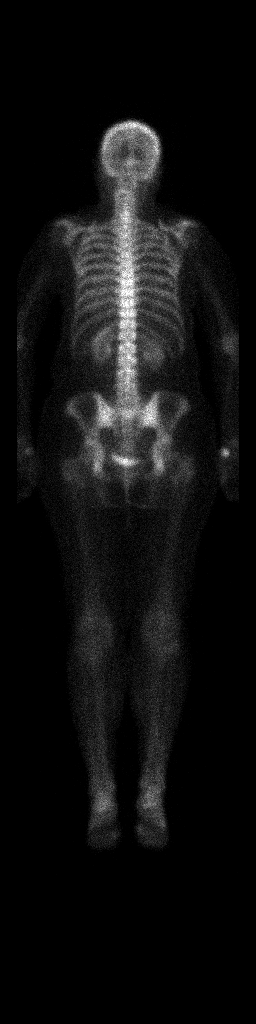

[Series 1000: statics · 2.40mm/px · 2 acquisitions, 4 frames shown]
[im 1/2]
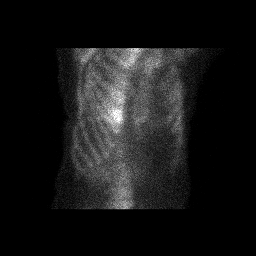
[im 1/2]
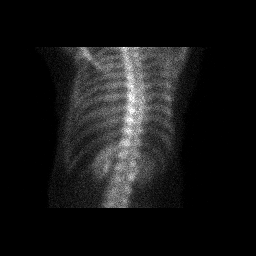
[im 2/2]
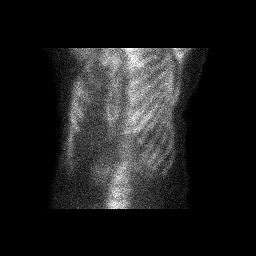
[im 2/2]
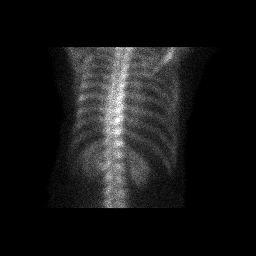

[6 of 6 positions shown; findings below may reference images not displayed]

FINDINGS: Physiologic distribution of radiotracer with bilateral renal uptake
and excretion. Long segment symmetric uptake within the bilateral
tibias. Symmetric uptake within the bilateral maxillary region.
Typical degenerative uptake within the bilateral feet and base of
thumbs, right worse than left.
IMPRESSION: 1. Long segment symmetric uptake within the bilateral tibial
diaphyses. This finding is most commonly seen in the setting of
chronic venous insufficiency. Less commonly, this can be seen in the
setting of hypertrophic osteoarthropathy. Correlate for the presence
of underlying lung disease.
2. Symmetric uptake in the bilateral maxillary regions, which could
be secondary to underlying sinusitis.
3. Degenerative type uptake within the bilateral feet and base of
the thumbs bilaterally.

## 2021-10-06 ENCOUNTER — Ambulatory Visit (INDEPENDENT_AMBULATORY_CARE_PROVIDER_SITE_OTHER): Payer: Medicare PPO

## 2021-10-06 VITALS — Ht 66.42 in | Wt 160.0 lb

## 2021-10-06 DIAGNOSIS — Z0000 Encounter for general adult medical examination without abnormal findings: Secondary | ICD-10-CM

## 2021-10-06 NOTE — Progress Notes (Signed)
Subjective:   Misty Rojas is a 66 y.o. female who presents for Medicare Annual (Subsequent) preventive examination.  Review of Systems    No ROS.  Medicare Wellness Virtual Visit.  Visual/audio telehealth visit, UTA vital signs.   See social history for additional risk factors.   Cardiac Risk Factors include: advanced age (>28mn, >>71women)     Objective:    Today's Vitals   10/06/21 1117  Weight: 160 lb (72.6 kg)  Height: 5' 6.42" (1.687 m)   Body mass index is 25.5 kg/m.     10/06/2021   11:24 AM 08/12/2020    2:59 PM 02/09/2018    9:43 AM  Advanced Directives  Does Patient Have a Medical Advance Directive? No No No  Type of ACorporate treasurerof ABuckleyLiving will   Does patient want to make changes to medical advance directive?  No - Patient declined   Copy of HEmpirein Chart?  No - copy requested   Would patient like information on creating a medical advance directive? No - Patient declined      Current Medications (verified) Outpatient Encounter Medications as of 10/06/2021  Medication Sig   buPROPion (WELLBUTRIN XL) 150 MG 24 hr tablet TAKE 1 TABLET BY MOUTH ONCE A DAY   Calcium Carbonate (CALCIUM 600 PO) Take 600 mg by mouth 2 (two) times daily.   Cholecalciferol (VITAMIN D-3) 1000 units CAPS Take 4 capsules by mouth daily.   fluticasone (FLONASE) 50 MCG/ACT nasal spray PLACE 2 SPRAYS INTO BOTH NOSTRILS DAILY AS NEEDED   Omega-3 Fatty Acids (FISH OIL) 1200 MG CAPS Take 1 capsule by mouth daily.   No facility-administered encounter medications on file as of 10/06/2021.   Allergies (verified) Patient has no known allergies.   History: Past Medical History:  Diagnosis Date   COVID-19    07/27/20   Family history of breast cancer    mother and m. aunt    Lateral epicondylitis    Medial epicondylitis    Medial epicondylitis    right    Rosacea    Sinusitis    Past Surgical History:  Procedure Laterality Date    WISDOM TOOTH EXTRACTION     Family History  Problem Relation Age of Onset   Arthritis Mother    Cancer Mother        breast    Breast cancer Mother 561  Cancer Father        prostate, kidney with mets   Alcohol abuse Sister    Cancer Sister    Early death Sister    Cancer Maternal Aunt        breast    Breast cancer Maternal Aunt [phone]  Arthritis Sister    Social History   Socioeconomic History   Marital status: Married    Spouse name: Not on file   Number of children: Not on file   Years of education: Not on file   Highest education level: Not on file  Occupational History   Not on file  Tobacco Use   Smoking status: Never   Smokeless tobacco: Never  Vaping Use   Vaping Use: Never used  Substance and Sexual Activity   Alcohol use: No   Drug use: No   Sexual activity: Not on file  Other Topics Concern   Not on file  Social History Narrative   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    2 children    Married  Social Determinants of Health   Financial Resource Strain: Low Risk  (10/06/2021)   Overall Financial Resource Strain (CARDIA)    Difficulty of Paying Living Expenses: Not hard at all  Food Insecurity: No Food Insecurity (10/06/2021)   Hunger Vital Sign    Worried About Running Out of Food in the Last Year: Never true    Ran Out of Food in the Last Year: Never true  Transportation Needs: No Transportation Needs (10/06/2021)   PRAPARE - Hydrologist (Medical): No    Lack of Transportation (Non-Medical): No  Physical Activity: Sufficiently Active (10/06/2021)   Exercise Vital Sign    Days of Exercise per Week: 5 days    Minutes of Exercise per Session: 40 min  Stress: No Stress Concern Present (10/06/2021)   [place]    Feeling of Stress : Not at all  Social Connections: Unknown (10/06/2021)   Social Connection and Isolation Panel [NHANES]    Frequency of Communication with  Friends and Family: More than three times a week    Frequency of Social Gatherings with Friends and Family: More than three times a week    Attends Religious Services: 1 to 4 times per year    Active Member of [employer] or Organizations: Not on file    Attends Archivist Meetings: Not on file    Marital Status: Married    Tobacco Counseling Counseling given: Not Answered  Clinical Intake: Pre-visit preparation completed: Yes        Diabetes: No  How often do you need to have someone help you when you read instructions, pamphlets, or other written materials from your doctor or pharmacy?: 1 - Never  Interpreter Needed?: No    Activities of Daily Living    10/06/2021   11:18 AM  In your present state of health, do you have any difficulty performing the following activities:  Hearing? 0  Vision? 0  Difficulty concentrating or making decisions? 0  Walking or climbing stairs? 0  Dressing or bathing? 0  Doing errands, shopping? 0  Preparing Food and eating ? N  Using the Toilet? N  In the past six months, have you accidently leaked urine? N  Do you have problems with loss of bowel control? N  Managing your Medications? N  Managing your Finances? N  Housekeeping or managing your Housekeeping? N   Patient Care Team: McLean-Scocuzza, Nino Glow, MD as PCP - General (Internal Medicine)  Indicate any recent Medical Services you may have received from other than [facility] providers in the past year (date may be approximate).     Assessment:   This is a routine wellness examination for Misty Rojas.  Virtual Visit via Telephone Note  I connected with  Misty Rojas on 10/06/21 at 11:15 AM EDT by telephone and verified that I am speaking with the correct person using two identifiers.  Location: Patient: home Provider: office Persons participating in the virtual visit: patient/Nurse Health Advisor   I discussed the limitations of performing an evaluation and management service by  telehealth. We continued and completed visit with audio only. Some vital signs may be absent or patient reported.   Hearing/Vision screen Hearing Screening - Comments:: Patient is able to hear conversational tones without difficulty. No issues reported. Vision Screening - Comments:: Followed by [facility] Wears corrective lenses  They have seen their ophthalmologist in the last 12 months.   Dietary issues and exercise  activities discussed: Current Exercise Habits: Home exercise routine, Type of exercise: walking, Time (Minutes): 45, Frequency (Times/Week): 3 (5,000 steps daily), Weekly Exercise (Minutes/Week): 135, Intensity: Moderate Regular diet Good water intake   Goals Addressed               This Visit's Progress     Patient Stated     Maintain healthy lifestyle (pt-stated)        Stay active and walk as often as possible Healthy diet       Depression Screen    10/06/2021   11:22 AM 08/12/2020    3:14 PM 07/09/2020    2:22 PM 04/19/2019   11:08 AM  PHQ 2/9 Scores  PHQ - 2 Score 0 0 0 0    Fall Risk    10/06/2021   11:21 AM 08/12/2020    3:20 PM 07/09/2020    2:22 PM 01/29/2020    9:00 AM 04/19/2019   11:08 AM  [place] in the past year? 0 0 0 0 0  Number falls in past yr: 0 0 0 0 0  Injury with Fall? 0  0 0 0  Follow up Falls evaluation completed  Falls evaluation completed Falls evaluation completed Falls evaluation completed    [place]: Home free of loose throw rugs in walkways, pet beds, electrical cords, etc? Yes  Adequate lighting in your home to reduce risk of falls? Yes   ASSISTIVE DEVICES UTILIZED TO PREVENT FALLS: Life alert? No  Use of a cane, walker or w/c? No   TIMED UP AND GO: Was the test performed? No .   Cognitive Function:        10/06/2021   11:38 AM  6CIT Screen  What Year? 0 points  What month? 0 points  What time? 0 points  Count back from 20 0 points  Months in reverse 0  points  Repeat phrase 0 points  Total Score 0 points    Immunizations Immunization History  Administered Date(s) Administered   Influenza, High Dose Seasonal PF 11/09/2019, 12/09/2020   Influenza,inj,Quad PF,6+ Mos 12/03/2015, 11/21/2017, 10/17/2018   Influenza,inj,quad, With Preservative 12/03/2015   Influenza-Unspecified 11/08/2016, 11/21/2017   MMR 07/08/2020   Moderna Covid-19 Vaccine Bivalent Booster 36yr & up 11/21/2020   Moderna SARS-COV2 Booster Vaccination 12/04/2019, 05/20/2020   Moderna Sars-Covid-2 Vaccination 05/07/2019, 06/04/2019   Pneumococcal Conjugate-13 02/12/2020   Pneumococcal Polysaccharide-23 03/03/2021   Tdap 01/16/2016   Zoster Recombinat (Shingrix) 04/19/2017, 06/30/2017   Screening Tests Health Maintenance  Topic Date Due   COVID-19 Vaccine (4 - Moderna risk series) 10/22/2021 (Originally 01/16/2021)   INFLUENZA VACCINE  05/09/2022 (Originally 09/08/2021)   MAMMOGRAM  09/01/2023   TETANUS/TDAP  01/15/2026   COLONOSCOPY (Pts 45-470yrInsurance coverage will need to be confirmed)  05/01/2031   Pneumonia Vaccine 6531Years old  Completed   DEXA SCAN  Completed   Hepatitis C Screening  Completed   Zoster Vaccines- Shingrix  Completed   HPV VACCINES  Aged Out   Health Maintenance There are no preventive care reminders to display for this patient.  Lung Cancer Screening: (Low Dose CT Chest recommended if Age 67-80ears, 30 pack-year currently smoking OR have quit w/in 15years.) does not qualify.   Vision Screening: Recommended annual ophthalmology exams for early detection of glaucoma and other disorders of the eye.  Dental Screening: Recommended annual dental exams for proper oral hygiene  Community Resource Referral / Chronic Care Management:  CRR required this visit?  No   CCM required this visit?  No      Plan:     I have personally reviewed and noted the following in the patient's chart:   Medical and social history Use of alcohol,  tobacco or illicit drugs  Current medications and supplements including opioid prescriptions. Patient is not currently taking opioid prescriptions. Functional ability and status Nutritional status Physical activity Advanced directives List of other physicians Hospitalizations, surgeries, and ER visits in previous 12 months Vitals Screenings to include cognitive, depression, and falls Referrals and appointments  In addition, I have reviewed and discussed with patient certain preventive protocols, quality metrics, and best practice recommendations. A written personalized care plan for preventive services as well as general preventive health recommendations were provided to patient.     Varney Biles, LPN   1/64/[id number]

## 2021-10-06 NOTE — Patient Instructions (Addendum)
Misty Rojas , Thank you for taking time to come for your Medicare Wellness Visit. I appreciate your ongoing commitment to your health goals. Please review the following plan we discussed and let me know if I can assist you in the future.   These are the goals we discussed:  Goals       Patient Stated     Maintain healthy lifestyle (pt-stated)      Stay active and walk as often as possible Healthy diet        This is a list of the screening recommended for you and due dates:  Health Maintenance  Topic Date Due   COVID-19 Vaccine (4 - Moderna risk series) 10/22/2021*   Flu Shot  05/09/2022*   Mammogram  09/01/2023   Tetanus Vaccine  01/15/2026   Colon Cancer Screening  05/01/2031   Pneumonia Vaccine  Completed   DEXA scan (bone density measurement)  Completed   Hepatitis C Screening: USPSTF Recommendation to screen - Ages 82-79 yo.  Completed   Zoster (Shingles) Vaccine  Completed   HPV Vaccine  Aged Out  *Topic was postponed. The date shown is not the original due date.   Preventive Care 42 Years and Older, Female Preventive care refers to lifestyle choices and visits with your health care provider that can promote health and wellness. What does preventive care include? A yearly physical exam. This is also called an annual well check. Dental exams once or twice a year. Routine eye exams. Ask your health care provider how often you should have your eyes checked. Personal lifestyle choices, including: Daily care of your teeth and gums. Regular physical activity. Eating a healthy diet. Avoiding tobacco and drug use. Limiting alcohol use. Practicing safe sex. Taking low-dose aspirin every day. Taking vitamin and mineral supplements as recommended by your health care provider. What happens during an annual well check? The services and screenings done by your health care provider during your annual well check will depend on your age, overall health, lifestyle risk factors, and  family history of disease. Counseling  Your health care provider may ask you questions about your: Alcohol use. Tobacco use. Drug use. Emotional well-being. Home and relationship well-being. Sexual activity. Eating habits. History of falls. Memory and ability to understand (cognition). Work and work Astronomer. Reproductive health. Screening  You may have the following tests or measurements: Height, weight, and BMI. Blood pressure. Lipid and cholesterol levels. These may be checked every 5 years, or more frequently if you are over 18 years old. Skin check. Lung cancer screening. You may have this screening every year starting at age 17 if you have a 30-pack-year history of smoking and currently smoke or have quit within the past 15 years. Fecal occult blood test (FOBT) of the stool. You may have this test every year starting at age 48. Flexible sigmoidoscopy or colonoscopy. You may have a sigmoidoscopy every 5 years or a colonoscopy every 10 years starting at age 9. Hepatitis C blood test. Hepatitis B blood test. Sexually transmitted disease (STD) testing. Diabetes screening. This is done by checking your blood sugar (glucose) after you have not eaten for a while (fasting). You may have this done every 1-3 years. Bone density scan. This is done to screen for osteoporosis. You may have this done starting at age 76. Mammogram. This may be done every 1-2 years. Talk to your health care provider about how often you should have regular mammograms. Talk with your health care provider about your  test results, treatment options, and if necessary, the need for more tests. Vaccines  Your health care provider may recommend certain vaccines, such as: Influenza vaccine. This is recommended every year. Tetanus, diphtheria, and acellular pertussis (Tdap, Td) vaccine. You may need a Td booster every 10 years. Zoster vaccine. You may need this after age 83. Pneumococcal 13-valent conjugate  (PCV13) vaccine. One dose is recommended after age 16. Pneumococcal polysaccharide (PPSV23) vaccine. One dose is recommended after age 18. Talk to your health care provider about which screenings and vaccines you need and how often you need them. This information is not intended to replace advice given to you by your health care provider. Make sure you discuss any questions you have with your health care provider. Document Released: 02/21/2015 Document Revised: 10/15/2015 Document Reviewed: 11/26/2014 Elsevier Interactive Patient Education  2017 [place] Prevention in the Home Falls can cause injuries. They can happen to people of all ages. There are many things you can do to make your home safe and to help prevent falls. What can I do on the outside of my home? Regularly fix the edges of walkways and driveways and fix any cracks. Remove anything that might make you trip as you walk through a door, such as a raised step or threshold. Trim any bushes or trees on the path to your home. Use bright outdoor lighting. Clear any walking paths of anything that might make someone trip, such as rocks or tools. Regularly check to see if handrails are loose or broken. Make sure that both sides of any steps have handrails. Any raised decks and porches should have guardrails on the edges. Have any leaves, snow, or ice cleared regularly. Use sand or salt on walking paths during winter. Clean up any spills in your garage right away. This includes oil or grease spills. What can I do in the bathroom? Use night lights. Install grab bars by the toilet and in the tub and shower. Do not use towel bars as grab bars. Use non-skid mats or decals in the tub or shower. If you need to sit down in the shower, use a plastic, non-slip stool. Keep the floor dry. Clean up any water that spills on the floor as soon as it happens. Remove soap buildup in the tub or shower regularly. Attach bath mats securely with  double-sided non-slip rug tape. Do not have throw rugs and other things on the floor that can make you trip. What can I do in the bedroom? Use night lights. Make sure that you have a light by your bed that is easy to reach. Do not use any sheets or blankets that are too big for your bed. They should not hang down onto the floor. Have a firm chair that has side arms. You can use this for support while you get dressed. Do not have throw rugs and other things on the floor that can make you trip. What can I do in the kitchen? Clean up any spills right away. Avoid walking on wet floors. Keep items that you use a lot in easy-to-reach places. If you need to reach something above you, use a strong step stool that has a grab bar. Keep electrical cords out of the way. Do not use floor polish or wax that makes floors slippery. If you must use wax, use non-skid floor wax. Do not have throw rugs and other things on the floor that can make you trip. What can I do with my  stairs? Do not leave any items on the stairs. Make sure that there are handrails on both sides of the stairs and use them. Fix handrails that are broken or loose. Make sure that handrails are as long as the stairways. Check any carpeting to make sure that it is firmly attached to the stairs. Fix any carpet that is loose or worn. Avoid having throw rugs at the top or bottom of the stairs. If you do have throw rugs, attach them to the floor with carpet tape. Make sure that you have a light switch at the top of the stairs and the bottom of the stairs. If you do not have them, ask someone to add them for you. What else can I do to help prevent falls? Wear shoes that: Do not have high heels. Have rubber bottoms. Are comfortable and fit you well. Are closed at the toe. Do not wear sandals. If you use a stepladder: Make sure that it is fully opened. Do not climb a closed stepladder. Make sure that both sides of the stepladder are locked  into place. Ask someone to hold it for you, if possible. Clearly mark and make sure that you can see: Any grab bars or handrails. First and last steps. Where the edge of each step is. Use tools that help you move around (mobility aids) if they are needed. These include: Canes. Walkers. Scooters. Crutches. Turn on the lights when you go into a dark area. Replace any light bulbs as soon as they burn out. Set up your furniture so you have a clear path. Avoid moving your furniture around. If any of your floors are uneven, fix them. If there are any pets around you, be aware of where they are. Review your medicines with your doctor. Some medicines can make you feel dizzy. This can increase your chance of falling. Ask your doctor what other things that you can do to help prevent falls. This information is not intended to replace advice given to you by your health care provider. Make sure you discuss any questions you have with your health care provider. Document Released: 11/21/2008 Document Revised: 07/03/2015 Document Reviewed: 03/01/2014 Elsevier Interactive Patient Education  2017 [employer].

## 2021-10-07 ENCOUNTER — Other Ambulatory Visit: Payer: Self-pay | Admitting: Internal Medicine

## 2021-10-07 DIAGNOSIS — J324 Chronic pansinusitis: Secondary | ICD-10-CM

## 2021-12-22 ENCOUNTER — Other Ambulatory Visit: Payer: Self-pay

## 2021-12-22 ENCOUNTER — Telehealth: Payer: Self-pay | Admitting: Internal Medicine

## 2021-12-22 DIAGNOSIS — J324 Chronic pansinusitis: Secondary | ICD-10-CM

## 2021-12-22 MED ORDER — FLUTICASONE PROPIONATE 50 MCG/ACT NA SUSP: 2.0000 | Freq: Every day | NASAL | 0 refills | Status: DC | PRN

## 2021-12-22 NOTE — Telephone Encounter (Signed)
Pt need a refill on fluticasone sent to [facility] pharmacy

## 2021-12-22 NOTE — Telephone Encounter (Signed)
Refill sent to pharmacy.  Pt has an appointment with Dr. Clent Ridges 03/02/22

## 2022-02-02 ENCOUNTER — Other Ambulatory Visit: Payer: Self-pay | Admitting: Family

## 2022-02-02 ENCOUNTER — Encounter: Payer: Medicare PPO | Admitting: Internal Medicine

## 2022-02-02 ENCOUNTER — Telehealth: Payer: Self-pay | Admitting: Internal Medicine

## 2022-02-02 ENCOUNTER — Other Ambulatory Visit: Payer: Self-pay | Admitting: Internal Medicine

## 2022-02-02 DIAGNOSIS — J324 Chronic pansinusitis: Secondary | ICD-10-CM

## 2022-02-02 MED ORDER — FLUTICASONE PROPIONATE 50 MCG/ACT NA SUSP: 2.0000 | Freq: Every day | NASAL | 0 refills | Status: DC | PRN

## 2022-02-02 NOTE — Telephone Encounter (Signed)
Prescription Request  02/02/2022  Is this a "Controlled Substance" medicine? No  LOV: Visit date not found  What is the name of the medication or equipment? fluticasone (FLONASE) 50 MCG/ACT nasal spray  Have you contacted your pharmacy to request a refill? Yes   Which pharmacy would you like this sent to?  [facility] - [place], [place] - [address] [place] [address] Phone: [phone] Fax: [phone]     Patient notified that their request is being sent to the clinical staff for review and that they should receive a response within 2 business days.   Please advise at [facility] [phone]

## 2022-03-02 ENCOUNTER — Encounter: Payer: Medicare PPO | Admitting: Family Medicine

## 2022-03-02 ENCOUNTER — Encounter: Payer: Self-pay | Admitting: Nurse Practitioner

## 2022-03-02 ENCOUNTER — Ambulatory Visit: Payer: Medicare PPO | Admitting: Nurse Practitioner

## 2022-03-02 VITALS — BP 122/60 | HR 74 | Temp 97.6°F | Ht 66.42 in | Wt 169.8 lb

## 2022-03-02 DIAGNOSIS — M85851 Other specified disorders of bone density and structure, right thigh: Secondary | ICD-10-CM

## 2022-03-02 DIAGNOSIS — Z1329 Encounter for screening for other suspected endocrine disorder: Secondary | ICD-10-CM

## 2022-03-02 DIAGNOSIS — J324 Chronic pansinusitis: Secondary | ICD-10-CM

## 2022-03-02 DIAGNOSIS — Z13220 Encounter for screening for lipoid disorders: Secondary | ICD-10-CM | POA: Diagnosis not present

## 2022-03-02 DIAGNOSIS — Z0000 Encounter for general adult medical examination without abnormal findings: Secondary | ICD-10-CM | POA: Diagnosis not present

## 2022-03-02 DIAGNOSIS — Z1231 Encounter for screening mammogram for malignant neoplasm of breast: Secondary | ICD-10-CM

## 2022-03-02 DIAGNOSIS — F32A Depression, unspecified: Secondary | ICD-10-CM | POA: Diagnosis not present

## 2022-03-02 DIAGNOSIS — F339 Major depressive disorder, recurrent, unspecified: Secondary | ICD-10-CM | POA: Insufficient documentation

## 2022-03-02 LAB — CBC WITH DIFFERENTIAL/PLATELET
Basophils Absolute: 0 10*3/uL (ref 0.0–0.1)
Basophils Relative: 0.6 % (ref 0.0–3.0)
Eosinophils Absolute: 0.4 10*3/uL (ref 0.0–0.7)
Eosinophils Relative: 7.6 % — ABNORMAL HIGH (ref 0.0–5.0)
HCT: 40.4 % (ref 36.0–46.0)
Hemoglobin: 13.4 g/dL (ref 12.0–15.0)
Lymphocytes Relative: 39.7 % (ref 12.0–46.0)
Lymphs Abs: 2.1 10*3/uL (ref 0.7–4.0)
MCHC: 33.3 g/dL (ref 30.0–36.0)
MCV: 94.9 fl (ref 78.0–100.0)
Monocytes Absolute: 0.4 10*3/uL (ref 0.1–1.0)
Monocytes Relative: 7 % (ref 3.0–12.0)
Neutro Abs: 2.4 10*3/uL (ref 1.4–7.7)
Neutrophils Relative %: 45.1 % (ref 43.0–77.0)
Platelets: 283 10*3/uL (ref 150.0–400.0)
RBC: 4.26 Mil/uL (ref 3.87–5.11)
RDW: 13.1 % (ref 11.5–15.5)
WBC: 5.2 10*3/uL (ref 4.0–10.5)

## 2022-03-02 LAB — LIPID PANEL
Cholesterol: 189 mg/dL (ref 0–200)
HDL: 54.7 mg/dL (ref >39.00)
LDL Cholesterol: 113 mg/dL — ABNORMAL HIGH (ref 0–99)
NonHDL: 134.19
Total CHOL/HDL Ratio: 3
Triglycerides: 106 mg/dL (ref 0.0–149.0)
VLDL: 21.2 mg/dL (ref 0.0–40.0)

## 2022-03-02 LAB — COMPREHENSIVE METABOLIC PANEL
ALT: 14 U/L (ref 0–35)
AST: 24 U/L (ref 0–37)
Albumin: 4.3 g/dL (ref 3.5–5.2)
Alkaline Phosphatase: 103 U/L (ref 39–117)
BUN: 18 mg/dL (ref 6–23)
CO2: 31 mEq/L (ref 19–32)
Calcium: 9.7 mg/dL (ref 8.4–10.5)
Chloride: 101 mEq/L (ref 96–112)
Creatinine, Ser: 1.13 mg/dL (ref 0.40–1.20)
GFR: 50.33 mL/min — ABNORMAL LOW (ref >60.00)
Glucose, Bld: 85 mg/dL (ref 70–99)
Potassium: 4.3 mEq/L (ref 3.5–5.1)
Sodium: 139 mEq/L (ref 135–145)
Total Bilirubin: 1 mg/dL (ref 0.2–1.2)
Total Protein: 7.5 g/dL (ref 6.0–8.3)

## 2022-03-02 LAB — TSH: TSH: 2.73 u[IU]/mL (ref 0.35–5.50)

## 2022-03-02 LAB — VITAMIN D 25 HYDROXY (VIT D DEFICIENCY, FRACTURES): VITD: 79.57 ng/mL (ref 30.00–100.00)

## 2022-03-02 MED ORDER — BUPROPION HCL ER (XL) 150 MG PO TB24: 150.0000 mg | ORAL_TABLET | Freq: Every day | ORAL | 3 refills | Status: DC

## 2022-03-02 MED ORDER — FLUTICASONE PROPIONATE 50 MCG/ACT NA SUSP: 2.0000 | Freq: Every day | NASAL | 5 refills | Status: DC | PRN

## 2022-03-02 NOTE — Assessment & Plan Note (Signed)
Chronic. Stable on Wellbutrin XL 150 mg daily. Continue.

## 2022-03-02 NOTE — Assessment & Plan Note (Addendum)
DEXA from July 2023 reviewed. Continue OTC Vitamin D and Calcium daily. Continue weight bearing exercises.

## 2022-03-02 NOTE — Assessment & Plan Note (Addendum)
Physical exam complete. Lab work as outlined. Will contact patient with results. Mammogram due in July 2024. DEXA and Colon- UTD. All vaccines- UTD. Pap- UTD, no longer indicated due to age. Encouraged patient to continue healthy diet and exercise.

## 2022-03-02 NOTE — Progress Notes (Signed)
Tomasita Morrow, NP-C Phone: [phone]  Misty Rojas is a 67 y.o. female who presents today for transfer of care and annual exam. She has no complaints or new concerns today. She is doing well on her medications and is requesting refills.   Depression- Patient reports symptoms are well managed on Wellbutrin XL. Denies SI/HI  Osteopenia- No recent falls or breaks. Taking OTC Vit D and calcium daily. She is walking a lot and recently joined an aerobic and free weights class at the [facility] that she attends 3-4 days per week.   Diet: Well rounded, includes a lot of vegetables and low fat proteins Exercise: Walking a lot, recently joined classes at the [facility] Pap smear: 01/29/20- Negative Colonoscopy: 04/30/21- 10 year recall  Mammogram: 08/31/21 Family history-  Colon cancer: No  Breast cancer: Yes, mother and maternal aunt  Ovarian cancer: No Menses: Postmenopausal Vaccines-   Flu: UTD  Tetanus: UTD- Due in 2027  Shingles: Completed  COVID19: x 5  Pneumonia: Completed Hep C Screening: Negative Tobacco use: No Alcohol use: No Illicit Drug use: No Dentist: Yes Ophthalmology: Yes   Social History   Tobacco Use  Smoking Status Never  Smokeless Tobacco Never    Current Outpatient Medications on File Prior to Visit  Medication Sig Dispense Refill   Calcium Carbonate (CALCIUM 600 PO) Take 600 mg by mouth 2 (two) times daily.     Cholecalciferol (VITAMIN D-3) 1000 units CAPS Take 4 capsules by mouth daily.     Omega-3 Fatty Acids (FISH OIL) 1200 MG CAPS Take 1 capsule by mouth daily.     No current facility-administered medications on file prior to visit.     ROS see history of present illness  Objective  Physical Exam Vitals:   03/02/22 1255  BP: 122/60  Pulse: 74  Temp: 97.6 F (36.4 C)  SpO2: 98%    BP Readings from Last 3 Encounters:  03/02/22 122/60  01/29/21 118/70  01/10/21 (!) 146/82   Wt Readings from Last 3 Encounters:  03/02/22 169 lb 12.8 oz (77 kg)   10/06/21 160 lb (72.6 kg)  01/29/21 160 lb 12.8 oz (72.9 kg)    Physical Exam Constitutional:      General: She is not in acute distress.    Appearance: Normal appearance.  HENT:     Head: Normocephalic.     Right Ear: Tympanic membrane normal.     Left Ear: Tympanic membrane normal.     Nose: Nose normal.     Mouth/Throat:     Mouth: Mucous membranes are moist.     Pharynx: Oropharynx is clear.  Eyes:     Conjunctiva/sclera: Conjunctivae normal.     Pupils: Pupils are equal, round, and reactive to light.  Cardiovascular:     Rate and Rhythm: Normal rate and regular rhythm.     Heart sounds: Normal heart sounds.  Pulmonary:     Effort: Pulmonary effort is normal.     Breath sounds: Normal breath sounds.  Abdominal:     General: Abdomen is flat. Bowel sounds are normal.     Palpations: Abdomen is soft.     Tenderness: There is no abdominal tenderness.  Musculoskeletal:        General: Normal range of motion.  Skin:    General: Skin is warm and dry.  Neurological:     General: No focal deficit present.     Mental Status: She is alert.  Psychiatric:        Mood  and Affect: Mood normal.        Behavior: Behavior normal.    Assessment/Plan: Please see individual problem list.  Preventative health care Assessment & Plan: Physical exam complete. Lab work as outlined. Will contact patient with results. Mammogram due in July 2024. DEXA and Colon- UTD. All vaccines- UTD. Pap- UTD, no longer indicated due to age. Encouraged patient to continue healthy diet and exercise.   Orders: -     CBC with Differential/Platelet -     Comprehensive metabolic panel -     TSH -     Lipid panel -     VITAMIN D 25 Hydroxy (Vit-D Deficiency, Fractures)  Depression, unspecified depression type Assessment & Plan: Chronic. Stable on Wellbutrin XL 150 mg daily. Continue.   Orders: -     buPROPion HCl ER (XL); Take 1 tablet (150 mg total) by mouth daily.  Dispense: 90 tablet; Refill:  3  Osteopenia of neck of right femur Assessment & Plan: DEXA from July 2023 reviewed. Continue OTC Vitamin D and Calcium daily. Continue weight bearing exercises.   Orders: -     VITAMIN D 25 Hydroxy (Vit-D Deficiency, Fractures)  Pansinusitis, unspecified chronicity -     Fluticasone Propionate; Place 2 sprays into both nostrils daily as needed.  Dispense: 16 g; Refill: 5  Screening for thyroid disorder -     TSH  Lipid screening -     Lipid panel  Screening mammogram for breast cancer -     3D Screening Mammogram, Left and Right; Future    Return in about 1 year (around 03/03/2023).   Tomasita Morrow, NP-C [place]

## 2022-03-12 ENCOUNTER — Other Ambulatory Visit: Payer: Self-pay

## 2022-05-18 NOTE — Progress Notes (Signed)
Bethanie Dicker, NP-C Phone: [phone]  Misty Rojas is a 67 y.o. female who presents today for right ear pain.   Ear Pain: Patient presents with right ear pain.  Symptoms include right ear pain, congestion, cough, and plugged sensation in the right ear. Symptoms began 4 days ago and are gradually worsening since that time. Patient denies chills, dyspnea, and fever.   Social History   Tobacco Use  Smoking Status Never  Smokeless Tobacco Never    Current Outpatient Medications on File Prior to Visit  Medication Sig Dispense Refill   buPROPion (WELLBUTRIN XL) 150 MG 24 hr tablet Take 1 tablet (150 mg total) by mouth daily. 90 tablet 3   Calcium Carbonate (CALCIUM 600 PO) Take 600 mg by mouth 2 (two) times daily.     Cholecalciferol (VITAMIN D-3) 1000 units CAPS Take 4 capsules by mouth daily.     fluticasone (FLONASE) 50 MCG/ACT nasal spray Place 2 sprays into both nostrils daily as needed. 16 g 5   Omega-3 Fatty Acids (FISH OIL) 1200 MG CAPS Take 1 capsule by mouth daily.     No current facility-administered medications on file prior to visit.    ROS see history of present illness  Objective  Physical Exam Vitals:   05/19/22 0804  BP: 122/66  Pulse: 82  Temp: 98.9 F (37.2 C)  SpO2: 97%    BP Readings from Last 3 Encounters:  05/19/22 122/66  03/02/22 122/60  01/29/21 118/70   Wt Readings from Last 3 Encounters:  05/19/22 168 lb (76.2 kg)  03/02/22 169 lb 12.8 oz (77 kg)  10/06/21 160 lb (72.6 kg)    Physical Exam Constitutional:      General: She is not in acute distress.    Appearance: Normal appearance.  HENT:     Head: Normocephalic.     Right Ear: Ear canal and external ear normal. A middle ear effusion is present. Tympanic membrane is erythematous and bulging.     Left Ear: Tympanic membrane normal.     Nose: Nose normal.     Mouth/Throat:     Mouth: Mucous membranes are moist.     Pharynx: Oropharynx is clear.  Eyes:     Conjunctiva/sclera:  Conjunctivae normal.     Pupils: Pupils are equal, round, and reactive to light.  Cardiovascular:     Rate and Rhythm: Normal rate and regular rhythm.     Heart sounds: Normal heart sounds.  Pulmonary:     Effort: Pulmonary effort is normal.     Breath sounds: Normal breath sounds.  Lymphadenopathy:     Cervical: No cervical adenopathy.  Skin:    General: Skin is warm and dry.  Neurological:     General: No focal deficit present.     Mental Status: She is alert.  Psychiatric:        Mood and Affect: Mood normal.        Behavior: Behavior normal.    Assessment/Plan: Please see individual problem list.  Non-recurrent acute suppurative otitis media of right ear without spontaneous rupture of tympanic membrane Assessment & Plan: Exam consistent with right ear infection, will treat with Augmentin 875 BID x 10 days. Counseled on side effects such as diarrhea, encouraged to take probiotic while on antibiotic. Advised to continue Flonase daily. She can take Tylenol/Ibuprofen for pain. Encouraged adequate fluid intake. Return precautions given to patient.   Orders: -     Amoxicillin-Pot Clavulanate; Take 1 tablet by mouth 2 (two) times daily.  Dispense: 20 tablet; Refill: 0      Return if symptoms worsen or fail to improve.   Bethanie Dicker, NP-C [facility] - [employer]

## 2022-05-19 ENCOUNTER — Encounter: Payer: Self-pay | Admitting: Nurse Practitioner

## 2022-05-19 ENCOUNTER — Ambulatory Visit (INDEPENDENT_AMBULATORY_CARE_PROVIDER_SITE_OTHER): Payer: Medicare PPO | Admitting: Nurse Practitioner

## 2022-05-19 VITALS — BP 122/66 | HR 82 | Temp 98.9°F | Ht 66.42 in | Wt 168.0 lb

## 2022-05-19 DIAGNOSIS — H66001 Acute suppurative otitis media without spontaneous rupture of ear drum, right ear: Secondary | ICD-10-CM | POA: Insufficient documentation

## 2022-05-19 MED ORDER — AMOXICILLIN-POT CLAVULANATE 875-125 MG PO TABS: 1.0000 | ORAL_TABLET | Freq: Two times a day (BID) | ORAL | 0 refills | Status: DC

## 2022-05-19 NOTE — Assessment & Plan Note (Signed)
Exam consistent with right ear infection, will treat with Augmentin 875 BID x 10 days. Counseled on side effects such as diarrhea, encouraged to take probiotic while on antibiotic. Advised to continue Flonase daily. She can take Tylenol/Ibuprofen for pain. Encouraged adequate fluid intake. Return precautions given to patient.

## 2022-09-02 ENCOUNTER — Ambulatory Visit
Admission: RE | Admit: 2022-09-02 | Discharge: 2022-09-02 | Disposition: A | Discharge status: Home / Self Care | Payer: Medicare PPO | Source: Ambulatory Visit | Attending: Nurse Practitioner | Admitting: Nurse Practitioner

## 2022-09-02 DIAGNOSIS — Z1231 Encounter for screening mammogram for malignant neoplasm of breast: Secondary | ICD-10-CM | POA: Diagnosis present

## 2022-11-12 ENCOUNTER — Other Ambulatory Visit: Payer: Self-pay | Admitting: Family

## 2022-11-12 DIAGNOSIS — J324 Chronic pansinusitis: Secondary | ICD-10-CM

## 2022-11-13 ENCOUNTER — Encounter: Payer: Self-pay | Admitting: Nurse Practitioner

## 2022-11-13 DIAGNOSIS — F32A Depression, unspecified: Secondary | ICD-10-CM

## 2022-11-13 DIAGNOSIS — J324 Chronic pansinusitis: Secondary | ICD-10-CM

## 2022-11-16 MED ORDER — FLUTICASONE PROPIONATE 50 MCG/ACT NA SUSP
2.0000 | Freq: Every day | NASAL | 5 refills | Status: DC | PRN
Start: 2022-11-16 — End: 2023-07-07

## 2023-02-23 ENCOUNTER — Other Ambulatory Visit: Payer: Self-pay | Admitting: Nurse Practitioner

## 2023-02-23 DIAGNOSIS — F32A Depression, unspecified: Secondary | ICD-10-CM

## 2023-03-03 ENCOUNTER — Ambulatory Visit: Payer: Medicare PPO | Admitting: Nurse Practitioner

## 2023-03-03 ENCOUNTER — Encounter: Payer: Self-pay | Admitting: Nurse Practitioner

## 2023-03-03 ENCOUNTER — Other Ambulatory Visit: Payer: Medicare PPO

## 2023-03-03 ENCOUNTER — Other Ambulatory Visit: Payer: Self-pay

## 2023-03-03 VITALS — BP 110/68 | HR 67 | Temp 97.7°F | Ht 66.42 in | Wt 169.8 lb

## 2023-03-03 DIAGNOSIS — Z1329 Encounter for screening for other suspected endocrine disorder: Secondary | ICD-10-CM | POA: Diagnosis not present

## 2023-03-03 DIAGNOSIS — Z1231 Encounter for screening mammogram for malignant neoplasm of breast: Secondary | ICD-10-CM

## 2023-03-03 DIAGNOSIS — Z13220 Encounter for screening for lipoid disorders: Secondary | ICD-10-CM

## 2023-03-03 DIAGNOSIS — F339 Major depressive disorder, recurrent, unspecified: Secondary | ICD-10-CM | POA: Diagnosis not present

## 2023-03-03 DIAGNOSIS — Z0000 Encounter for general adult medical examination without abnormal findings: Secondary | ICD-10-CM | POA: Diagnosis not present

## 2023-03-03 DIAGNOSIS — S82832G Other fracture of upper and lower end of left fibula, subsequent encounter for closed fracture with delayed healing: Secondary | ICD-10-CM | POA: Insufficient documentation

## 2023-03-03 DIAGNOSIS — R899 Unspecified abnormal finding in specimens from other organs, systems and tissues: Secondary | ICD-10-CM

## 2023-03-03 DIAGNOSIS — M85851 Other specified disorders of bone density and structure, right thigh: Secondary | ICD-10-CM | POA: Diagnosis not present

## 2023-03-03 LAB — CBC WITH DIFFERENTIAL/PLATELET
Basophils Absolute: 0 10*3/uL (ref 0.0–0.1)
Basophils Relative: 1 % (ref 0.0–3.0)
Eosinophils Absolute: 0.3 10*3/uL (ref 0.0–0.7)
Eosinophils Relative: 7.3 % — ABNORMAL HIGH (ref 0.0–5.0)
HCT: 40.8 % (ref 36.0–46.0)
Hemoglobin: 13.5 g/dL (ref 12.0–15.0)
Lymphocytes Relative: 35.8 % (ref 12.0–46.0)
Lymphs Abs: 1.6 10*3/uL (ref 0.7–4.0)
MCHC: 33 g/dL (ref 30.0–36.0)
MCV: 95.6 fL (ref 78.0–100.0)
Monocytes Absolute: 0.3 10*3/uL (ref 0.1–1.0)
Monocytes Relative: 7.2 % (ref 3.0–12.0)
Neutro Abs: 2.1 10*3/uL (ref 1.4–7.7)
Neutrophils Relative %: 48.7 % (ref 43.0–77.0)
Platelets: 283 10*3/uL (ref 150.0–400.0)
RBC: 4.27 Mil/uL (ref 3.87–5.11)
RDW: 13 % (ref 11.5–15.5)
WBC: 4.3 10*3/uL (ref 4.0–10.5)

## 2023-03-03 LAB — COMPREHENSIVE METABOLIC PANEL
ALT: 16 U/L (ref 0–35)
AST: 23 U/L (ref 0–37)
Albumin: 4.3 g/dL (ref 3.5–5.2)
Alkaline Phosphatase: 113 U/L (ref 39–117)
BUN: 19 mg/dL (ref 6–23)
CO2: 29 meq/L (ref 19–32)
Calcium: 9.3 mg/dL (ref 8.4–10.5)
Chloride: 104 meq/L (ref 96–112)
Creatinine, Ser: 1.21 mg/dL — ABNORMAL HIGH (ref 0.40–1.20)
GFR: 46.04 mL/min — ABNORMAL LOW (ref >60.00)
Glucose, Bld: 91 mg/dL (ref 70–99)
Potassium: 4.7 meq/L (ref 3.5–5.1)
Sodium: 140 meq/L (ref 135–145)
Total Bilirubin: 0.9 mg/dL (ref 0.2–1.2)
Total Protein: 7.4 g/dL (ref 6.0–8.3)

## 2023-03-03 LAB — LIPID PANEL
Cholesterol: 194 mg/dL (ref 0–200)
HDL: 59 mg/dL (ref >39.00)
LDL Cholesterol: 122 mg/dL — ABNORMAL HIGH (ref 0–99)
NonHDL: 134.97
Total CHOL/HDL Ratio: 3
Triglycerides: 65 mg/dL (ref 0.0–149.0)
VLDL: 13 mg/dL (ref 0.0–40.0)

## 2023-03-03 LAB — VITAMIN D 25 HYDROXY (VIT D DEFICIENCY, FRACTURES): VITD: 75.33 ng/mL (ref 30.00–100.00)

## 2023-03-03 LAB — TSH: TSH: 3.3 u[IU]/mL (ref 0.35–5.50)

## 2023-03-03 NOTE — Assessment & Plan Note (Signed)
The fracture sustained in August is still not fully healed. A bone stimulator is in use, and daily activities are performed without impairment. Continue current management and follow-up with orthopedics as scheduled.

## 2023-03-03 NOTE — Progress Notes (Signed)
Bethanie Dicker, NP-C Phone: [phone]  Misty Rojas is a 68 y.o. female who presents today for annual exam.  Discussed the use of AI scribe software for clinical note transcription with the patient, who gave verbal consent to proceed.  History of Present Illness   The patient, with a history of low bone density, presented for annual exam. They reported a fall in August, resulting in a broken ankle. The patient was managing the pain with daily Meloxicam 15mg  and using a bone stimulator to aid healing. Despite the bone not being fully healed, the patient reported no significant impairment in daily activities, including exercise at the gym and walking outdoors.  The patient also reported a significant family stressor, with their seven-year-old grandson recently diagnosed with Type 1 Diabetes. This has led to some fluctuations in mood, which the patient believes are normal under the circumstances. They are currently on Wellbutrin for mood management.  The patient maintains a regular exercise routine, including upper body strength training, elliptical, and treadmill workouts. They reported a well-rounded diet, with a mix of home-cooked meals and dining out. They are also taking Vitamin D and Calcium supplements for their low bone density.  The patient has been up-to-date with their health screenings, including a colonoscopy, mammogram, and vaccines. They also regularly see a dentist and an eye doctor. They denied tobacco, alcohol and drug use. They denied any new symptoms or health concerns.      Social History   Tobacco Use  Smoking Status Never  Smokeless Tobacco Never    Current Outpatient Medications on File Prior to Visit  Medication Sig Dispense Refill   buPROPion (WELLBUTRIN XL) 150 MG 24 hr tablet TAKE ONE TABLET BY MOUTH ONCE A DAY 90 tablet 3   Calcium Carbonate (CALCIUM 600 PO) Take 600 mg by mouth 2 (two) times daily.     Cholecalciferol (VITAMIN D-3) 1000 units CAPS Take 4  capsules by mouth daily.     fluticasone (FLONASE) 50 MCG/ACT nasal spray Place 2 sprays into both nostrils daily as needed. 16 g 5   Omega-3 Fatty Acids (FISH OIL) 1200 MG CAPS Take 1 capsule by mouth daily.     No current facility-administered medications on file prior to visit.    ROS see history of present illness  Objective  Physical Exam Vitals:   03/03/23 0834  BP: 110/68  Pulse: 67  Temp: 97.7 F (36.5 C)  SpO2: 96%    BP Readings from Last 3 Encounters:  03/03/23 110/68  05/19/22 122/66  03/02/22 122/60   Wt Readings from Last 3 Encounters:  03/03/23 169 lb 12.8 oz (77 kg)  05/19/22 168 lb (76.2 kg)  03/02/22 169 lb 12.8 oz (77 kg)    Physical Exam Constitutional:      General: She is not in acute distress.    Appearance: Normal appearance.  HENT:     Head: Normocephalic.     Right Ear: Tympanic membrane normal.     Left Ear: Tympanic membrane normal.     Nose: Nose normal.     Mouth/Throat:     Mouth: Mucous membranes are moist.     Pharynx: Oropharynx is clear.  Eyes:     Conjunctiva/sclera: Conjunctivae normal.     Pupils: Pupils are equal, round, and reactive to light.  Neck:     Thyroid: No thyromegaly.  Cardiovascular:     Rate and Rhythm: Normal rate and regular rhythm.     Heart sounds: Normal heart sounds.  Pulmonary:  Effort: Pulmonary effort is normal.     Breath sounds: Normal breath sounds.  Abdominal:     General: Abdomen is flat. Bowel sounds are normal.     Palpations: Abdomen is soft. There is no mass.     Tenderness: There is no abdominal tenderness.  Musculoskeletal:        General: Normal range of motion.  Lymphadenopathy:     Cervical: No cervical adenopathy.  Skin:    General: Skin is warm and dry.     Findings: No rash.  Neurological:     General: No focal deficit present.     Mental Status: She is alert.  Psychiatric:        Mood and Affect: Mood normal.        Behavior: Behavior normal.    Assessment/Plan:  Please see individual problem list.  Preventative health care Assessment & Plan: Physical exam complete. We will order routine lab work as outlined and contact patient with the results. Pap smear no longer indicated. Mammogram is up to date, with next due in July, order placed for patient to schedule. Her colonoscopy is up to date. She has completed the Shingles and Pneumonia vaccine series. Flu and tetanus vaccines are up to date. She has received 4 COVID vaccines. Continue routine dental and eye exams. Encouraged healthy diet and regular exercise. Return to care in one year, sooner as needed.   Orders: -     CBC with Differential/Platelet -     Comprehensive metabolic panel  Closed fracture of distal end of left fibula with delayed healing, unspecified fracture morphology, subsequent encounter Assessment & Plan: The fracture sustained in August is still not fully healed. A bone stimulator is in use, and daily activities are performed without impairment. Continue current management and follow-up with orthopedics as scheduled.   Osteopenia of neck of right femur Assessment & Plan: A DEXA scan in July 2023 indicated low bone density. Vitamin D and calcium supplementation are ongoing, along with weight-bearing exercises. Continue these interventions.  Orders: -     VITAMIN D 25 Hydroxy (Vit-D Deficiency, Fractures)  Depression, recurrent (HCC) Assessment & Plan: Mood remains well controlled, despite increased life stressors recently, on Wellbutrin XL 150mg  daily. Continue. Encouraged to contact if worsening symptoms, unusual behavior changes or suicidal thoughts occur.    Thyroid disorder screen -     TSH  Lipid screening -     Lipid panel  Screening mammogram for breast cancer -     3D Screening Mammogram, Left and Right; Future   Return in about 1 year (around 03/02/2024) for Annual Exam, sooner as needed.   Bethanie Dicker, NP-C [facility] - [facility]

## 2023-03-03 NOTE — Assessment & Plan Note (Addendum)
Physical exam complete. We will order routine lab work as outlined and contact patient with the results. Pap smear no longer indicated. Mammogram is up to date, with next due in July, order placed for patient to schedule. Her colonoscopy is up to date. She has completed the Shingles and Pneumonia vaccine series. Flu and tetanus vaccines are up to date. She has received 4 COVID vaccines. Continue routine dental and eye exams. Encouraged healthy diet and regular exercise. Return to care in one year, sooner as needed.

## 2023-03-03 NOTE — Patient Instructions (Signed)
YOUR MAMMOGRAM IS DUE, PLEASE CALL AND GET THIS SCHEDULED! [facility] - call [phone]    

## 2023-03-03 NOTE — Assessment & Plan Note (Signed)
A DEXA scan in July 2023 indicated low bone density. Vitamin D and calcium supplementation are ongoing, along with weight-bearing exercises. Continue these interventions.

## 2023-03-03 NOTE — Assessment & Plan Note (Signed)
Mood remains well controlled, despite increased life stressors recently, on Wellbutrin XL 150mg  daily. Continue. Encouraged to contact if worsening symptoms, unusual behavior changes or suicidal thoughts occur.

## 2023-04-04 ENCOUNTER — Other Ambulatory Visit (INDEPENDENT_AMBULATORY_CARE_PROVIDER_SITE_OTHER): Payer: Medicare PPO

## 2023-04-04 DIAGNOSIS — R899 Unspecified abnormal finding in specimens from other organs, systems and tissues: Secondary | ICD-10-CM

## 2023-04-04 LAB — BASIC METABOLIC PANEL
BUN: 18 mg/dL (ref 6–23)
CO2: 29 meq/L (ref 19–32)
Calcium: 9.5 mg/dL (ref 8.4–10.5)
Chloride: 103 meq/L (ref 96–112)
Creatinine, Ser: 1.19 mg/dL (ref 0.40–1.20)
GFR: 46.94 mL/min — ABNORMAL LOW (ref >60.00)
Glucose, Bld: 95 mg/dL (ref 70–99)
Potassium: 4.9 meq/L (ref 3.5–5.1)
Sodium: 140 meq/L (ref 135–145)

## 2023-04-05 ENCOUNTER — Encounter: Payer: Self-pay | Admitting: Nurse Practitioner

## 2023-07-06 ENCOUNTER — Other Ambulatory Visit: Payer: Self-pay | Admitting: Nurse Practitioner

## 2023-07-06 DIAGNOSIS — J324 Chronic pansinusitis: Secondary | ICD-10-CM

## 2023-09-05 ENCOUNTER — Ambulatory Visit
Admission: RE | Admit: 2023-09-05 | Discharge: 2023-09-05 | Disposition: A | Discharge status: Home / Self Care | Source: Ambulatory Visit | Attending: Nurse Practitioner | Admitting: Nurse Practitioner

## 2023-09-05 DIAGNOSIS — Z1231 Encounter for screening mammogram for malignant neoplasm of breast: Secondary | ICD-10-CM | POA: Diagnosis present

## 2023-09-07 ENCOUNTER — Ambulatory Visit: Payer: Self-pay | Admitting: Nurse Practitioner

## 2023-10-28 ENCOUNTER — Encounter: Payer: Self-pay | Admitting: Nurse Practitioner

## 2024-02-22 ENCOUNTER — Other Ambulatory Visit: Payer: Self-pay | Admitting: Nurse Practitioner

## 2024-02-22 DIAGNOSIS — F32A Depression, unspecified: Secondary | ICD-10-CM

## 2024-02-28 ENCOUNTER — Other Ambulatory Visit: Payer: Self-pay | Admitting: Nurse Practitioner

## 2024-02-28 DIAGNOSIS — J324 Chronic pansinusitis: Secondary | ICD-10-CM

## 2024-03-06 ENCOUNTER — Other Ambulatory Visit: Payer: Self-pay | Admitting: Medical Genetics

## 2024-03-07 ENCOUNTER — Ambulatory Visit: Payer: Medicare PPO | Admitting: Nurse Practitioner

## 2024-03-07 ENCOUNTER — Encounter: Payer: Self-pay | Admitting: Nurse Practitioner

## 2024-03-07 VITALS — BP 120/64 | HR 73 | Temp 98.2°F | Ht 66.42 in | Wt 168.8 lb

## 2024-03-07 DIAGNOSIS — M85851 Other specified disorders of bone density and structure, right thigh: Secondary | ICD-10-CM

## 2024-03-07 DIAGNOSIS — F339 Major depressive disorder, recurrent, unspecified: Secondary | ICD-10-CM

## 2024-03-07 DIAGNOSIS — Z1231 Encounter for screening mammogram for malignant neoplasm of breast: Secondary | ICD-10-CM

## 2024-03-07 DIAGNOSIS — Z0000 Encounter for general adult medical examination without abnormal findings: Secondary | ICD-10-CM

## 2024-03-07 DIAGNOSIS — Z1329 Encounter for screening for other suspected endocrine disorder: Secondary | ICD-10-CM

## 2024-03-07 DIAGNOSIS — E7800 Pure hypercholesterolemia, unspecified: Secondary | ICD-10-CM | POA: Insufficient documentation

## 2024-03-07 LAB — COMPREHENSIVE METABOLIC PANEL WITH GFR
ALT: 17 U/L (ref 3–35)
AST: 24 U/L (ref 5–37)
Albumin: 4.1 g/dL (ref 3.5–5.2)
Alkaline Phosphatase: 112 U/L (ref 39–117)
BUN: 20 mg/dL (ref 6–23)
CO2: 31 meq/L (ref 19–32)
Calcium: 9.8 mg/dL (ref 8.4–10.5)
Chloride: 103 meq/L (ref 96–112)
Creatinine, Ser: 1.25 mg/dL — ABNORMAL HIGH (ref 0.40–1.20)
GFR: 43.96 mL/min — ABNORMAL LOW
Glucose, Bld: 90 mg/dL (ref 70–99)
Potassium: 4.9 meq/L (ref 3.5–5.1)
Sodium: 138 meq/L (ref 135–145)
Total Bilirubin: 0.9 mg/dL (ref 0.2–1.2)
Total Protein: 7.2 g/dL (ref 6.0–8.3)

## 2024-03-07 LAB — CBC WITH DIFFERENTIAL/PLATELET
Basophils Absolute: 0 10*3/uL (ref 0.0–0.1)
Basophils Relative: 0.7 % (ref 0.0–3.0)
Eosinophils Absolute: 0.4 10*3/uL (ref 0.0–0.7)
Eosinophils Relative: 8.3 % — ABNORMAL HIGH (ref 0.0–5.0)
HCT: 40.2 % (ref 36.0–46.0)
Hemoglobin: 13.6 g/dL (ref 12.0–15.0)
Lymphocytes Relative: 30.5 % (ref 12.0–46.0)
Lymphs Abs: 1.7 10*3/uL (ref 0.7–4.0)
MCHC: 33.8 g/dL (ref 30.0–36.0)
MCV: 92.8 fl (ref 78.0–100.0)
Monocytes Absolute: 0.4 10*3/uL (ref 0.1–1.0)
Monocytes Relative: 7.6 % (ref 3.0–12.0)
Neutro Abs: 2.9 10*3/uL (ref 1.4–7.7)
Neutrophils Relative %: 52.9 % (ref 43.0–77.0)
Platelets: 267 10*3/uL (ref 150.0–400.0)
RBC: 4.33 Mil/uL (ref 3.87–5.11)
RDW: 13.4 % (ref 11.5–15.5)
WBC: 5.4 10*3/uL (ref 4.0–10.5)

## 2024-03-07 LAB — LIPID PANEL
Cholesterol: 170 mg/dL (ref 28–200)
HDL: 47.4 mg/dL
LDL Cholesterol: 102 mg/dL — ABNORMAL HIGH (ref 10–99)
NonHDL: 122.44
Total CHOL/HDL Ratio: 4
Triglycerides: 102 mg/dL (ref 10.0–149.0)
VLDL: 20.4 mg/dL (ref 0.0–40.0)

## 2024-03-07 LAB — TSH: TSH: 2.79 u[IU]/mL (ref 0.35–5.50)

## 2024-03-07 LAB — VITAMIN D 25 HYDROXY (VIT D DEFICIENCY, FRACTURES): VITD: 85.45 ng/mL (ref 30.00–100.00)

## 2024-03-07 NOTE — Patient Instructions (Signed)
 YOUR BONE DENISTY SCAN (dexa)  IS DUE, PLEASE CALL AND GET THIS SCHEDULED! [facility] - call [phone]   YOUR MAMMOGRAM IS DUE, PLEASE CALL AND GET THIS SCHEDULED! [facility] - call [phone]

## 2024-03-07 NOTE — Progress Notes (Signed)
 " Misty Glance, NP-C Phone: [phone]  Misty Rojas is a 69 y.o. female who presents today for annual exam.   Discussed the use of AI scribe software for clinical note transcription with the patient, who gave verbal consent to proceed.  History of Present Illness   Misty Rojas is a 69 year old female who presents for an annual physical exam.  She is currently taking Wellbutrin  150 mg and experiences periods of excessive worry. Life events, such as a trip to [place], may have impacted her mood. She has considered increasing the dose but has decided to maintain the current dose for now.  She has a history of osteopenia and had a fracture in the past. Her last DEXA scan was in 2023. She continues to take vitamin D .  Her family history includes breast cancer, but no history of colon or ovarian cancer. She has received six COVID vaccines and a flu shot this year. Her vaccinations for tetanus, shingles, and pneumonia are up to date.  She does not smoke, drink alcohol, or use drugs. She visits the dentist and eye doctor regularly. She exercises regularly, attending classes at the [facility] three to four times a week, and walks when she cannot attend classes. Her diet is balanced, and she is mindful of eating healthy foods, although she does not restrict herself or weigh herself regularly.  No chest pain, shortness of breath, abdominal pain, constipation, diarrhea, urinary symptoms, headaches, dizziness, swallowing difficulties, skin changes, joint pain, or leg swelling. Her sleep is good, and she describes herself as an early riser who sleeps through the night.      Tobacco Use History[1]  Medications Ordered Prior to Encounter[2]   ROS see history of present illness  Objective  Physical Exam Vitals:   03/07/24 0821  BP: 120/64  Pulse: 73  Temp: 98.2 F (36.8 C)  SpO2: 98%    BP Readings from Last 3 Encounters:  03/07/24 120/64  03/03/23 110/68  05/19/22 122/66   Wt Readings from  Last 3 Encounters:  03/07/24 168 lb 12.8 oz (76.6 kg)  03/03/23 169 lb 12.8 oz (77 kg)  05/19/22 168 lb (76.2 kg)    Physical Exam Constitutional:      General: She is not in acute distress.    Appearance: Normal appearance.  HENT:     Head: Normocephalic.     Right Ear: Tympanic membrane normal.     Left Ear: Tympanic membrane normal.     Nose: Nose normal.     Mouth/Throat:     Mouth: Mucous membranes are moist.     Pharynx: Oropharynx is clear.  Eyes:     Conjunctiva/sclera: Conjunctivae normal.     Pupils: Pupils are equal, round, and reactive to light.  Neck:     Thyroid: No thyromegaly.  Cardiovascular:     Rate and Rhythm: Normal rate and regular rhythm.     Heart sounds: Normal heart sounds.  Pulmonary:     Effort: Pulmonary effort is normal.     Breath sounds: Normal breath sounds.  Abdominal:     General: Abdomen is flat. Bowel sounds are normal.     Palpations: Abdomen is soft. There is no mass.     Tenderness: There is no abdominal tenderness.  Musculoskeletal:        General: Normal range of motion.  Lymphadenopathy:     Cervical: No cervical adenopathy.  Skin:    General: Skin is warm and dry.     Findings: No  rash.  Neurological:     General: No focal deficit present.     Mental Status: She is alert.  Psychiatric:        Mood and Affect: Mood normal.        Behavior: Behavior normal.      Assessment/Plan: Please see individual problem list.  Routine general medical examination at a health care facility Assessment & Plan: Physical exam complete. We will check lab work as outlined. Pap smear no longer indicated. Mammogram and colonoscopy are up to date. Flu, tetanus and COVID vaccines are up to date. She has completed the Shingles and Pneumonia vaccine series. She does not smoke, consume alcohol, or use drugs. Regular dental and eye check-ups are maintained. She follows a balanced diet and regular exercise routine. A DEXA scan has been ordered. A  mammogram is scheduled for July. She should continue her current exercise and diet regimen. Return to care in one year, sooner as needed.   Orders: -     CBC with Differential/Platelet -     Comprehensive metabolic panel with GFR  Depression, recurrent Assessment & Plan: Her mood is well-managed on Wellbutrin  150 mg, with occasional anxiety. She prefers to maintain the current dosage despite discussing an increase. Potential for winter blues was acknowledged, with an agreement to reevaluate in spring if needed. Continue Wellbutrin  150 mg daily and monitor mood. Encouraged to contact if worsening symptoms, unusual behavior changes or suicidal thoughts occur.    Osteopenia of neck of right femur Assessment & Plan: Osteopenia is present with a history of previous fracture. The last DEXA scan was in 2023, and a repeat scan is ordered to monitor bone density. Vitamin D  and calcium supplementation are ongoing, along with weight-bearing exercises. Continue these interventions.  Orders: -     VITAMIN D  25 Hydroxy (Vit-D Deficiency, Fractures) -     DG Bone Density; Future  Elevated LDL cholesterol level Assessment & Plan: Managed with healthy diet and regular exercise. Check lipid panel.   Orders: -     Lipid panel  Thyroid disorder screen -     TSH  Screening mammogram for breast cancer -     3D Screening Mammogram, Left and Right; Future     Return in about 1 year (around 03/07/2025) for Annual Exam, sooner as needed.   Misty Glance, NP-C [facility] - [place]     [1]  Social History Tobacco Use  Smoking Status Never  Smokeless Tobacco Never  [2]  Current Outpatient Medications on File Prior to Visit  Medication Sig Dispense Refill   buPROPion  (WELLBUTRIN  XL) 150 MG 24 hr tablet TAKE ONE TABLET BY MOUTH ONCE A DAY 90 tablet 3   Calcium Carbonate (CALCIUM 600 PO) Take 600 mg by mouth 2 (two) times daily.     Cholecalciferol (VITAMIN D -3) 1000 units CAPS  Take 4 capsules by mouth daily.     fluticasone  (FLONASE ) 50 MCG/ACT nasal spray PLACE TWO SPRAYS INTO BOTH NOSTRILS DAILY AS NEEDED. 16 mL 5   Omega-3 Fatty Acids (FISH OIL) 1200 MG CAPS Take 1 capsule by mouth daily.     No current facility-administered medications on file prior to visit.   "

## 2024-03-09 ENCOUNTER — Ambulatory Visit: Payer: Self-pay | Admitting: Nurse Practitioner

## 2024-03-16 ENCOUNTER — Encounter: Payer: Self-pay | Admitting: Nurse Practitioner

## 2024-03-16 NOTE — Assessment & Plan Note (Signed)
 Managed with healthy diet and regular exercise. Check lipid panel.

## 2024-03-16 NOTE — Assessment & Plan Note (Signed)
 Physical exam complete. We will check lab work as outlined. Pap smear no longer indicated. Mammogram and colonoscopy are up to date. Flu, tetanus and COVID vaccines are up to date. She has completed the Shingles and Pneumonia vaccine series. She does not smoke, consume alcohol, or use drugs. Regular dental and eye check-ups are maintained. She follows a balanced diet and regular exercise routine. A DEXA scan has been ordered. A mammogram is scheduled for July. She should continue her current exercise and diet regimen. Return to care in one year, sooner as needed.

## 2024-03-16 NOTE — Assessment & Plan Note (Signed)
 Osteopenia is present with a history of previous fracture. The last DEXA scan was in 2023, and a repeat scan is ordered to monitor bone density. Vitamin D  and calcium supplementation are ongoing, along with weight-bearing exercises. Continue these interventions.

## 2024-03-16 NOTE — Assessment & Plan Note (Signed)
 Her mood is well-managed on Wellbutrin  150 mg, with occasional anxiety. She prefers to maintain the current dosage despite discussing an increase. Potential for winter blues was acknowledged, with an agreement to reevaluate in spring if needed. Continue Wellbutrin  150 mg daily and monitor mood. Encouraged to contact if worsening symptoms, unusual behavior changes or suicidal thoughts occur.

## 2024-09-05 ENCOUNTER — Encounter

## 2024-09-05 ENCOUNTER — Other Ambulatory Visit

## 2025-03-08 ENCOUNTER — Encounter: Admitting: Nurse Practitioner
